# Patient Record
Sex: Male | Born: 1985 | Race: Black or African American | Hispanic: No | Marital: Married | Smoking: Never smoker
Health system: Southern US, Community
[De-identification: ages and names within clinical notes are randomized; demographics above are authoritative.]

## PROBLEM LIST (undated history)

## (undated) ENCOUNTER — Emergency Department (HOSPITAL_COMMUNITY): Disposition: A | Discharge status: Home / Self Care | Payer: BLUE CROSS/BLUE SHIELD

## (undated) DIAGNOSIS — W3400XA Accidental discharge from unspecified firearms or gun, initial encounter: Secondary | ICD-10-CM

## (undated) DIAGNOSIS — F329 Major depressive disorder, single episode, unspecified: Secondary | ICD-10-CM

---
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
## 2004-05-14 ENCOUNTER — Emergency Department (HOSPITAL_COMMUNITY): Admission: EM | Admit: 2004-05-14 | Discharge: 2004-05-14 | Payer: Self-pay

## 2007-10-13 ENCOUNTER — Emergency Department (HOSPITAL_COMMUNITY): Admission: EM | Admit: 2007-10-13 | Discharge: 2007-10-13 | Payer: Self-pay | Admitting: Family Medicine

## 2008-03-04 ENCOUNTER — Emergency Department (HOSPITAL_COMMUNITY): Admission: EM | Admit: 2008-03-04 | Discharge: 2008-03-04 | Payer: Self-pay | Admitting: Emergency Medicine

## 2008-08-09 ENCOUNTER — Emergency Department (HOSPITAL_COMMUNITY): Admission: EM | Admit: 2008-08-09 | Discharge: 2008-08-10 | Payer: Self-pay | Admitting: Emergency Medicine

## 2010-09-25 ENCOUNTER — Emergency Department (HOSPITAL_COMMUNITY)
Admission: EM | Admit: 2010-09-25 | Discharge: 2010-09-25 | Disposition: A | Discharge status: Home / Self Care | Payer: Self-pay | Attending: Emergency Medicine | Admitting: Emergency Medicine

## 2010-09-25 DIAGNOSIS — M542 Cervicalgia: Secondary | ICD-10-CM | POA: Insufficient documentation

## 2010-09-25 DIAGNOSIS — M545 Low back pain, unspecified: Secondary | ICD-10-CM | POA: Insufficient documentation

## 2010-09-25 DIAGNOSIS — F17200 Nicotine dependence, unspecified, uncomplicated: Secondary | ICD-10-CM | POA: Insufficient documentation

## 2010-09-25 DIAGNOSIS — N342 Other urethritis: Secondary | ICD-10-CM | POA: Insufficient documentation

## 2010-09-25 DIAGNOSIS — T148XXA Other injury of unspecified body region, initial encounter: Secondary | ICD-10-CM | POA: Insufficient documentation

## 2010-09-25 DIAGNOSIS — W010XXA Fall on same level from slipping, tripping and stumbling without subsequent striking against object, initial encounter: Secondary | ICD-10-CM | POA: Insufficient documentation

## 2010-09-25 LAB — URINALYSIS, ROUTINE W REFLEX MICROSCOPIC
Bilirubin Urine: NEGATIVE
Hgb urine dipstick: NEGATIVE
Ketones, ur: NEGATIVE mg/dL
Nitrite: NEGATIVE
Protein, ur: NEGATIVE mg/dL
Specific Gravity, Urine: 1.022 (ref 1.005–1.030)
Urine Glucose, Fasting: NEGATIVE mg/dL
Urobilinogen, UA: 1 mg/dL (ref 0.0–1.0)
pH: 7 (ref 5.0–8.0)

## 2010-09-25 LAB — URINE MICROSCOPIC-ADD ON

## 2010-09-26 LAB — GC/CHLAMYDIA PROBE AMP, GENITAL
Chlamydia, DNA Probe: POSITIVE — AB
GC Probe Amp, Genital: POSITIVE — AB

## 2011-04-20 ENCOUNTER — Inpatient Hospital Stay (HOSPITAL_COMMUNITY)
Admission: RE | Admit: 2011-04-20 | Disposition: A | Discharge status: Home / Self Care | Payer: Self-pay | Source: Ambulatory Visit | Attending: Family Medicine | Admitting: Family Medicine

## 2011-04-20 ENCOUNTER — Inpatient Hospital Stay (INDEPENDENT_AMBULATORY_CARE_PROVIDER_SITE_OTHER)
Admission: RE | Admit: 2011-04-20 | Discharge: 2011-04-20 | Disposition: A | Discharge status: Home / Self Care | Payer: Self-pay | Source: Ambulatory Visit | Attending: Emergency Medicine | Admitting: Emergency Medicine

## 2011-04-20 ENCOUNTER — Ambulatory Visit (INDEPENDENT_AMBULATORY_CARE_PROVIDER_SITE_OTHER): Payer: Self-pay

## 2011-04-20 DIAGNOSIS — J020 Streptococcal pharyngitis: Secondary | ICD-10-CM

## 2011-04-20 DIAGNOSIS — R0789 Other chest pain: Secondary | ICD-10-CM

## 2011-04-20 LAB — POCT RAPID STREP A: Streptococcus, Group A Screen (Direct): POSITIVE — AB

## 2012-05-10 ENCOUNTER — Emergency Department (HOSPITAL_COMMUNITY)
Admission: EM | Admit: 2012-05-10 | Discharge: 2012-05-10 | Disposition: A | Discharge status: Home / Self Care | Payer: Self-pay | Attending: Emergency Medicine | Admitting: Emergency Medicine

## 2012-05-10 ENCOUNTER — Encounter (HOSPITAL_COMMUNITY): Payer: Self-pay | Admitting: Emergency Medicine

## 2012-05-10 DIAGNOSIS — J111 Influenza due to unidentified influenza virus with other respiratory manifestations: Secondary | ICD-10-CM | POA: Insufficient documentation

## 2012-05-10 DIAGNOSIS — Z888 Allergy status to other drugs, medicaments and biological substances status: Secondary | ICD-10-CM | POA: Insufficient documentation

## 2012-05-10 DIAGNOSIS — F17200 Nicotine dependence, unspecified, uncomplicated: Secondary | ICD-10-CM | POA: Insufficient documentation

## 2012-05-10 MED ORDER — OXYCODONE-ACETAMINOPHEN 5-325 MG PO TABS: 1.0000 | ORAL_TABLET | ORAL | Status: DC | PRN

## 2012-05-10 MED ORDER — PROMETHAZINE HCL 12.5 MG PO TABS: 12.5000 mg | ORAL_TABLET | Freq: Four times a day (QID) | ORAL | Status: DC | PRN

## 2012-05-10 MED ORDER — OXYCODONE-ACETAMINOPHEN 5-325 MG PO TABS
1.0000 | ORAL_TABLET | Freq: Once | ORAL | Status: AC
  Administered 2012-05-10: 1 via ORAL
  Filled 2012-05-10: qty 1

## 2012-05-10 NOTE — ED Provider Notes (Signed)
History     CSN: [id number]  Arrival date & time 05/10/12  1430   First MD Initiated Contact with Patient 05/10/12 1509      No chief complaint on file.   (Consider location/radiation/quality/duration/timing/severity/associated sxs/prior treatment) HPI Pt has four days of malaise, body aches, headache, lack of appetite, and low grade fevers.  He has had a mild sore throat and has noticed some right sided neck lumps.  These are moderately tender.  Denies any nausea/vomiting but admits to some diarrhea.  He is feeling a bit better now than 4 days ago.  No rashes.  Denies any recent illicit substance use although he has used TCH several months ago.  History reviewed. No pertinent past medical history.  History reviewed. No pertinent past surgical history.  No family history on file.  History  Substance Use Topics  . Smoking status: Current Every Day Smoker  . Smokeless tobacco: Not on file  . Alcohol Use: Yes      Review of Systems  Constitutional: Positive for fever, activity change, appetite change and fatigue. Negative for diaphoresis.  HENT: Positive for ear pain, sore throat and neck pain. Negative for mouth sores, trouble swallowing, neck stiffness, voice change, postnasal drip and ear discharge.   Eyes: Negative for pain.  Respiratory: Negative for chest tightness and shortness of breath.   Cardiovascular: Negative for chest pain.  Gastrointestinal: Positive for diarrhea. Negative for nausea and vomiting.  Genitourinary: Negative for dysuria.  Musculoskeletal: Positive for myalgias.  Neurological: Positive for light-headedness. Negative for dizziness.    Allergies  Mucinex  Home Medications   Current Outpatient Rx  Name Route Sig Dispense Refill  . OXYCODONE-ACETAMINOPHEN 5-325 MG PO TABS Oral Take 1 tablet by mouth every 4 (four) hours as needed for pain. 15 tablet 0  . PROMETHAZINE HCL 12.5 MG PO TABS Oral Take 1 tablet (12.5 mg total) by mouth every 6 (six)  hours as needed for nausea. 30 tablet 0    BP 128/70  Pulse 74  Temp 98.4 F (36.9 C)  Resp 16  SpO2 99%  Physical Exam  Constitutional: He is oriented to person, place, and time. He appears well-developed.  HENT:  Head: Normocephalic and atraumatic.  Right Ear: External ear normal.  Left Ear: External ear normal.  Mouth/Throat: Oropharynx is clear and moist. No oropharyngeal exudate.       Slight clear effusion behind left TM  Eyes: Conjunctivae normal and EOM are normal. Pupils are equal, round, and reactive to light.  Neck: Normal range of motion. Neck supple.       Bilateral cervical adenopathy, R>L.  Right slightly tender, left non-tender  Cardiovascular: Normal rate, regular rhythm and normal heart sounds.   Pulmonary/Chest: Effort normal and breath sounds normal.  Abdominal: Soft. He exhibits no distension. There is no tenderness.  Lymphadenopathy:    He has cervical adenopathy.  Neurological: He is alert and oriented to person, place, and time. No cranial nerve deficit. Coordination normal.  Skin: Skin is warm and dry.    ED Course  Procedures (including critical care time)  Labs Reviewed - No data to display No results found.   1. Influenza-like illness       MDM  Signs and symptoms suggestive of influenza like illness.  Doubt serious bacterial infection.  Given mobility and lack of neck stiffness likelihood of meningitis is very low.  Will give symptomatic treatment with pain meds and meds for nausea if it develops.  Brent Bulla, MD 05/10/12 [phone]

## 2012-05-10 NOTE — ED Provider Notes (Signed)
I saw and evaluated the patient, reviewed the resident's note and I agree with the findings and plan. Patient with viral type symptoms. No apparent meningeal signs. Will discharge home with pain medicines. Does not appear to need strep test at this time.  Juliet Rude. Rubin Payor, MD 05/10/12 [phone]

## 2012-05-10 NOTE — ED Notes (Signed)
For f our days has had h/a and swelling on side of neck  Throat has been sore feeling bad

## 2013-09-07 ENCOUNTER — Emergency Department (HOSPITAL_COMMUNITY)
Admission: EM | Admit: 2013-09-07 | Discharge: 2013-09-07 | Disposition: A | Discharge status: Home / Self Care | Payer: Self-pay | Attending: Emergency Medicine | Admitting: Emergency Medicine

## 2013-09-07 ENCOUNTER — Encounter (HOSPITAL_COMMUNITY): Payer: Self-pay | Admitting: Emergency Medicine

## 2013-09-07 DIAGNOSIS — K044 Acute apical periodontitis of pulpal origin: Secondary | ICD-10-CM | POA: Insufficient documentation

## 2013-09-07 DIAGNOSIS — K047 Periapical abscess without sinus: Secondary | ICD-10-CM

## 2013-09-07 DIAGNOSIS — F17200 Nicotine dependence, unspecified, uncomplicated: Secondary | ICD-10-CM | POA: Insufficient documentation

## 2013-09-07 LAB — RAPID STREP SCREEN (MED CTR MEBANE ONLY): Streptococcus, Group A Screen (Direct): NEGATIVE

## 2013-09-07 MED ORDER — HYDROCODONE-ACETAMINOPHEN 5-325 MG PO TABS: 1.0000 | ORAL_TABLET | Freq: Four times a day (QID) | ORAL | Status: DC | PRN

## 2013-09-07 MED ORDER — HYDROCODONE-ACETAMINOPHEN 5-325 MG PO TABS
2.0000 | ORAL_TABLET | Freq: Once | ORAL | Status: AC
  Administered 2013-09-07: 2 via ORAL
  Filled 2013-09-07: qty 2

## 2013-09-07 MED ORDER — AMOXICILLIN 500 MG PO CAPS: 500.0000 mg | ORAL_CAPSULE | Freq: Three times a day (TID) | ORAL | Status: DC

## 2013-09-07 NOTE — ED Notes (Signed)
Pt comfortable with d/c and f/u instructions. Prscriptions x2.

## 2013-09-07 NOTE — ED Provider Notes (Signed)
CSN: [id number]     Arrival date & time 09/07/13  1814 History   This chart was scribed for non-physician practitioner Arthor CaptainAbigail Kynlie Jane, PA-C, working with Juliet RudeNathan R. Rubin PayorPickering, MD by Donne Anonayla Curran, ED Scribe. This patient was seen in room TR05C/TR05C and the patient's care was started at 1916.   First MD Initiated Contact with Patient 09/07/13 1916     Chief Complaint  Patient presents with  . Sore Throat    The history is provided by the patient. No language interpreter was used.   HPI Comments: Alex McburneyDerrick A Cain is a 28 y.o. male who presents to the Emergency Department complaining of a gradual onset, gradually worsening, constant, moderate lower right sided dental pain that began 2 days ago and a sore throat that began this morning. He reports associated subjective fever and pain with swallowing. He denies difficulty breathing or any other symptoms. He has not tried any OTC medication for his symptoms.   He is a current everyday smoker.  History reviewed. No pertinent past medical history. History reviewed. No pertinent past surgical history. History reviewed. No pertinent family history. History  Substance Use Topics  . Smoking status: Current Every Day Smoker  . Smokeless tobacco: Not on file  . Alcohol Use: Yes    Review of Systems  Constitutional: Positive for fever.  HENT: Positive for dental problem and sore throat.   Respiratory: Negative for shortness of breath.     Allergies  Mucinex  Home Medications   Current Outpatient Rx  Name  Route  Sig  Dispense  Refill  . oxyCODONE-acetaminophen (ROXICET) 5-325 MG per tablet   Oral   Take 1 tablet by mouth every 4 (four) hours as needed for pain.   15 tablet   0   . promethazine (PHENERGAN) 12.5 MG tablet   Oral   Take 1 tablet (12.5 mg total) by mouth every 6 (six) hours as needed for nausea.   30 tablet   0    BP 133/71  Pulse 96  Temp(Src) 98.5 F (36.9 C) (Oral)  Resp 18  Ht 5\' 8"  (1.727 m)  Wt 150 lb  (68.04 kg)  BMI 22.81 kg/m2  SpO2 99%  Physical Exam  Nursing note and vitals reviewed. Constitutional: He appears well-developed and well-nourished. No distress.  HENT:  Head: Normocephalic and atraumatic.  Mouth/Throat: Oropharynx is clear and moist. No oropharyngeal exudate, posterior oropharyngeal edema or posterior oropharyngeal erythema.  swelling of gum over medial side of the 2nd and 3rd molar and right tonsillar lymphadenopathy. No swelling or erythema in the pharynx.   Eyes: Conjunctivae are normal.  Neck: Neck supple. No tracheal deviation present.  Cardiovascular: Normal rate.   Pulmonary/Chest: Effort normal. No respiratory distress.  Musculoskeletal: Normal range of motion.  Lymphadenopathy:       Head (right side): Tonsillar adenopathy present.  Neurological: He is alert.  Skin: Skin is warm and dry.  Psychiatric: He has a normal mood and affect. His behavior is normal.    ED Course  Procedures (including critical care time) DIAGNOSTIC STUDIES: Oxygen Saturation is 99% on RA, normal by my interpretation.    COORDINATION OF CARE: 7:44 PM Discussed treatment plan which included Vicodin for pain with pt at bedside and pt agreed to plan. Advised pt to follow up with dentist, referral given. Return precautions advised. Will discharge home with an antibiotic.    Labs Review Labs Reviewed  RAPID STREP SCREEN  CULTURE, GROUP A STREP   Imaging Review No  results found.  EKG Interpretation   None       MDM   1. Dental infection    Patient with toothache.  No gross abscess.  Exam unconcerning for Ludwig's angina or spread of infection.  Will treat with penicillin and pain medicine.  Urged patient to follow-up with dentist.     I personally performed the services described in this documentation, which was scribed in my presence. The recorded information has been reviewed and is accurate.     [name], PA-C 09/09/13 1204

## 2013-09-07 NOTE — Discharge Instructions (Signed)
You have been diagnosed with Dental pain. Please call the follow up dentist first thing in the morning on Monday for a follow up appointment. Keep your discharge paperwork from today's visit to bring to the dentist office. You may also use the resource guide listed below to help you find a dentist if you do not already have one to followup with. It is very important that you get evaluated by a dentist as soon as possible.  Use your pain medication as prescribed and do not operate heavy machinery while on pain medication. Note that your pain medication contains acetaminophen (Tylenol) & its is not reccommended that you use additional acetaminophen (Tylenol) while taking this medication. Take your full course of antibiotics. Read the instructions below. ° °Eat a soft or liquid diet and rinse your mouth out after meals with warm water. You should see a dentist or return here at once if you have increased swelling, increased pain or uncontrolled bleeding from the site of your injury. ° ° °SEEK MEDICAL CARE IF:  °· You have increased pain not controlled with medicines.  °· You have swelling around your tooth, in your face or neck.  °· You have bleeding which starts, continues, or gets worse.  °· You have a fever >101 °· If you are unable to open your mouth °Soft Diet  °The soft diet may be recommended after you were put on a full liquid diet. A normal diet may follow. The soft diet can also be used after surgery if you are too ill to keep down a normal diet. The soft diet may also be needed if you have a hard time chewing foods.  °DESCRIPTION  °Tender foods are used. Foods do not need to be ground or pureed. Most raw fruits and vegetables and coarse breads and cereals should be avoided. Fried foods and highly seasoned foods may cause discomfort.  °NUTRITIONAL ADEQUACY  °A healthy diet is possible if foods from each of the basic food groups are eaten daily.  °SOFT DIET FOOD LISTS  °Milk/Dairy  °Allowed: Milk and milk  drinks, milk shakes, cream cheese, cottage cheese, mild cheeses.  °Avoid: Sharp or highly seasoned cheese. °Meat/Meat Substitutes  °Allowed: Broiled, roasted, baked, or stewed tender lean beef, mutton, lamb, veal, chicken, turkey, liver, ham, crisp bacon, white fish, tuna, salmon. Eggs, smooth peanut butter.  °Avoid: All fried meats, fish, or fowl. Rich gravies and sauces. Lunch meats, sausages, hot dogs. Meats with gristle, chunky peanut butter. °Breads/Grains  °Allowed: Rice, noodles, spaghetti, macaroni. Dry or cooked refined cereals, such as farina, cream of wheat, oatmeal, grits, whole-wheat cereals. Plain or toasted white or wheat blend or whole-grain breads, soda crackers or saltines, flour tortillas.  °Avoid: Wild rice, coarse cereals, such as bran. Seed in or on breads and crackers. Bread or bread products with nuts or seeds. °Fruits/Vegetables  °Allowed: Fruit and vegetable juices, well-cooked or canned fruits and vegetables, any dried fruit. One citrus fruit daily, 1 vitamin A source daily. Well-ripened, easy to chew fruits, sweet potatoes. Baked, boiled, mashed, creamed, scalloped, or au gratin potatoes. Broths or creamed soups made with allowed vegetables, strained tomatoes.  °Avoid: All gas-forming vegetables (corn, radishes, Brussels sprouts, onions, broccoli, cabbage, parsnips, turnips, chili peppers, pinto beans, split peas, dried beans). Fruits containing seeds and skin. Potato chips and corn chips. All others that are not made with allowed vegetables. Highly seasoned soups. °Desserts/Sweets  °Allowed: Simple desserts, such as custard, junkets, gelatin desserts, plain ice cream and sherbets, simple cakes   and cookies, allowed fruits, sugar, syrup, jelly, honey, plain hard candy, and molasses.  °Avoid: Rich pastries, any dessert containing dates, nuts, raisins, or coconut. Fried pastries, such as doughnuts. Chocolate. °Beverages  °Allowed: Fruit and vegetable juices. Caffeine-free carbonated drinks,  coffee, and tea.  °Avoid: Caffeinated beverages: coffee, tea, soda or pop. °Miscellaneous  °Allowed: Butter, cream, margarine, mayonnaise, oil. Cream sauces, salt, and mild spices.  °Avoid: Highly spiced salad dressings. Highly seasoned foods, hot sauce, mustard, horseradish, and pepper. °SAMPLE MENU  °Breakfast  °Orange juice.  °Oatmeal.  °Soft cooked egg.  °Toast and margarine.  °2% milk.  °Coffee. °Lunch  °Meatloaf.  °Mashed potato.  °Green beans.  °Lemon pudding.  °Bread and margarine.  °Coffee. °Dinner  °Consommé or apricot nectar.  °Chicken breast.  °Rice, peas, and carrots.  °Applesauce.  °Bread and margarine.  °2% milk. °To cut the amount of fat in your diet, omit margarine and use 1% or skim milk.  °NUTRIENT ANALYSIS  °Calories........................1953 Kcal.  °Protein.........................102 gm.  °Carbohydrate...............247 gm.  °Fat................................65 gm.  °Cholesterol...................449 mg.  °Dietary fiber.................19 gm.  °Vitamin A.....................2944 RE.  °Vitamin C.....................79 mg.  °Niacin..........................25 mg.  °Riboflavin....................2.0 mg.  °Thiamin.......................1.5 mg.  °Folate..........................249 mcg.  °Calcium.......................1030 mg.  °Phosphorus.................1782 mg.  °Zinc..............................12 mg.  °Iron..............................13 mg.  °Sodium.........................299 mg.  °Potassium....................3046 mg. °Document Released: 10/28/2007 Document Revised: 10/13/2011 Document Reviewed: 10/28/2007  °ExitCare® Patient Information ©2014 [employer].  ° °RESOURCE GUIDE ° ° °Dental Problems ° °Dr. Janna Civilis °$200 dollar visit °[address] °[place], [place] [address]  °[phone] °  ° °Patients with Medicaid: °[facility]                     [facility] °[address]                                           [address] °Phone:   [phone]                                                  Phone:  [phone] ° °If unable to pay or uninsured, contact:  [facility] or [facility] to become qualified for the adult dental clinic. ° °Chronic Pain Problems °Contact [facility]  [phone] °Patients need to be referred by their primary care doctor. ° °Insufficient Money for Medicine °Contact [employer]:  call "211" or [facility] 271-5999. ° °No Primary Care Doctor °Call [facility]  832-8000 °Other agencies that provide inexpensive medical care °   [facility]  [phone] °   [facility]  [phone] °   [facility]  [phone] °   [facility]  [phone] °   [facility]  [phone] °   [facility]  [phone] ° °Psychological Services °[facility]  [phone] °[facility]  [phone] °[facility]   [phone] (emergency services [phone]) ° °Substance Abuse Resources °[facility]  336-882-2125 °[facility] 336-784-9470 °[facility] 336-285-9073 °[facility] 336-845-3988 °Residential & Outpatient Substance Abuse Program  800-659-3381 ° °Abuse/Neglect °[place] [facility] Hotline (336) 641-3795 °[place] [facility] Abuse Hotline 800-378-5315 (After Hours) ° °Emergency Shelter °[place]   [facility] (336) 271-5985 ° °Maternity Homes °[facility] (336) 275-9566 °[facility] (704) 372-4663 ° °MRSA Hotline #:   832-7006 ° ° ° °[place] [facility] ° °[facility]     [employer]                          [facility] °[address] [place]                       [address]      [address]  °[place]                                                [place]                            [place] °Phone:  349-3220                                   Phone:  [phone]                 Phone:   [phone] ° °[facility] °Phone:  342-8316 ° °[place] Child Abuse Hotline °(336) 342-1394 °(336) 342-3537 (After Hours) ° ° ° ° ° ° °

## 2013-09-07 NOTE — ED Notes (Signed)
Pt in c/o sore throat since yesterday, denies fever at home but feels like his throat is swollen, no medication PTA, no distress noted

## 2013-09-09 LAB — CULTURE, GROUP A STREP

## 2013-09-12 NOTE — ED Provider Notes (Signed)
Medical screening examination/treatment/procedure(s) were performed by non-physician practitioner and as supervising physician I was immediately available for consultation/collaboration.  EKG Interpretation   None        Bearl Talarico R. Shanicka Oldenkamp, MD 09/12/13 2044 

## 2014-03-14 ENCOUNTER — Emergency Department (HOSPITAL_COMMUNITY): Payer: Self-pay

## 2014-03-14 ENCOUNTER — Encounter (HOSPITAL_COMMUNITY): Payer: Self-pay | Admitting: Emergency Medicine

## 2014-03-14 ENCOUNTER — Emergency Department (HOSPITAL_COMMUNITY)
Admission: EM | Admit: 2014-03-14 | Discharge: 2014-03-14 | Disposition: A | Discharge status: Home / Self Care | Payer: Self-pay | Attending: Emergency Medicine | Admitting: Emergency Medicine

## 2014-03-14 DIAGNOSIS — F17200 Nicotine dependence, unspecified, uncomplicated: Secondary | ICD-10-CM | POA: Insufficient documentation

## 2014-03-14 DIAGNOSIS — Z792 Long term (current) use of antibiotics: Secondary | ICD-10-CM | POA: Insufficient documentation

## 2014-03-14 DIAGNOSIS — M5412 Radiculopathy, cervical region: Secondary | ICD-10-CM | POA: Insufficient documentation

## 2014-03-14 DIAGNOSIS — M542 Cervicalgia: Secondary | ICD-10-CM | POA: Insufficient documentation

## 2014-03-14 DIAGNOSIS — R209 Unspecified disturbances of skin sensation: Secondary | ICD-10-CM | POA: Insufficient documentation

## 2014-03-14 MED ORDER — KETOROLAC TROMETHAMINE 60 MG/2ML IM SOLN
60.0000 mg | Freq: Once | INTRAMUSCULAR | Status: AC
  Administered 2014-03-14: 60 mg via INTRAMUSCULAR
  Filled 2014-03-14: qty 2

## 2014-03-14 MED ORDER — PREDNISONE 20 MG PO TABS: 40.0000 mg | ORAL_TABLET | Freq: Every day | ORAL | Status: DC

## 2014-03-14 MED ORDER — NAPROXEN 500 MG PO TABS: 500.0000 mg | ORAL_TABLET | Freq: Two times a day (BID) | ORAL | Status: DC

## 2014-03-14 MED ORDER — CYCLOBENZAPRINE HCL 10 MG PO TABS: 10.0000 mg | ORAL_TABLET | Freq: Two times a day (BID) | ORAL | Status: DC | PRN

## 2014-03-14 NOTE — ED Provider Notes (Signed)
CSN: [id number]     Arrival date & time 03/14/14  1100 History  This chart was scribed for non-physician practitioner, Jaynie Crumble, PA-C working with Samuel Jester, DO by Greggory Stallion, ED scribe. This patient was seen in room TR05C/TR05C and the patient's care was started at 11:14 AM.    Chief Complaint  Patient presents with  . Neck Pain  . Back Pain   The history is provided by the patient. No language interpreter was used.   HPI Comments: Alex Cain is a 28 y.o. male who presents to the Emergency Department complaining of gradual onset sharp right sided neck pain that radiates into his upper back and right shoulder that started 2 weeks ago. States pain sometimes goes into his right axilla. Denies known injury. Pain is worse in the mornings. Reports intermittent numbness in his right thumb. Movement worsens the pain. Pt has taken BCs and goody powders with no relief. He has also used a heating pad with no relief. Denies past injury to neck.   History reviewed. No pertinent past medical history. History reviewed. No pertinent past surgical history. History reviewed. No pertinent family history. History  Substance Use Topics  . Smoking status: Current Every Day Smoker  . Smokeless tobacco: Not on file  . Alcohol Use: Yes    Review of Systems  Musculoskeletal: Positive for arthralgias, back pain and neck pain.  Neurological: Positive for numbness.  All other systems reviewed and are negative.  Allergies  Mucinex  Home Medications   Prior to Admission medications   Medication Sig Start Date End Date Taking? Authorizing Provider  amoxicillin (AMOXIL) 500 MG capsule Take 1 capsule (500 mg total) by mouth 3 (three) times daily. 09/07/13   [name], PA-C  HYDROcodone-acetaminophen (NORCO) 5-325 MG per tablet Take 1-2 tablets by mouth every 6 (six) hours as needed for moderate pain. 09/07/13   Abigail Harris, PA-C   BP 117/78  Pulse 72  Temp(Src) 98.2 F  (36.8 C) (Oral)  Resp 18  SpO2 100%  Physical Exam  Nursing note and vitals reviewed. Constitutional: He is oriented to person, place, and time. He appears well-developed and well-nourished. No distress.  HENT:  Head: Normocephalic and atraumatic.  Eyes: Conjunctivae and EOM are normal.  Neck: Neck supple. No tracheal deviation present.  No midline cervical spine tenderness. Right pericervical tenderness and right trapezius tenderness  Cardiovascular: Normal rate.   Pulmonary/Chest: Effort normal. No respiratory distress.  Musculoskeletal: Normal range of motion.  Normal appearing right shoulder. Tender over pectoralis muscle and right periscapular musculature. Full rom of the shoulder joint actively and passively. Distal radial pulses intact. Pt is able to make thumbs up, cross 2nd and 3rd digit, able to oppose thumb. Bicep, tricep deltoid strength intact.   Neurological: He is alert and oriented to person, place, and time.  Sensation intact in all dermatomes of the right hand to soft and sharp touch  Skin: Skin is warm and dry.  Psychiatric: He has a normal mood and affect. His behavior is normal.    ED Course  Procedures (including critical care time)  DIAGNOSTIC STUDIES: Oxygen Saturation is 100% on RA, normal by my interpretation.    COORDINATION OF CARE: 11:19 AM-Discussed treatment plan which includes neck xray with pt at bedside and pt agreed to plan.   Labs Review Labs Reviewed - No data to display  Imaging Review Dg Cervical Spine Complete  03/14/2014   CLINICAL DATA:  Right side neck pain.  EXAM: CERVICAL  SPINE  4+ VIEWS  COMPARISON:  None.  FINDINGS: Vertebral body height and alignment are normal. Intervertebral disc space height is maintained. Prevertebral soft tissues appear normal. Lung apices are clear.  IMPRESSION: Negative exam.   Electronically Signed   By: Drusilla Kannerhomas  Dalessio M.D.   On: 03/14/2014 11:57     EKG Interpretation None      MDM   Final  diagnoses:  Cervical radiculitis    Pt with right shoulder and right paracervical pain. Reports numbness sensation in right thumb, however on exam, neurovascularly intact. Strength intact. Will treat with prednisone, flexeril, follow up with PCP. Most likely muscle spasms vs radicular pain.   Filed Vitals:   03/14/14 1104 03/14/14 1233  BP: 117/78 117/60  Pulse: 72 61  Temp: 98.2 F (36.8 C) 98.3 F (36.8 C)  TempSrc: Oral Oral  Resp: 18 18  SpO2: 100% 100%     I personally performed the services described in this documentation, which was scribed in my presence. The recorded information has been reviewed and is accurate.  Lottie Musselatyana A Chelli Yerkes, PA-C 03/14/14 1253

## 2014-03-14 NOTE — ED Notes (Signed)
Pt reports pain to neck and upper back x 2 weeks, increases with movement. No acute distress noted.

## 2014-03-14 NOTE — Discharge Instructions (Signed)
Naprosyn for pain. Flexeril for spasms.  Prednisone for inflammation. No lifting heavier than 5 lb for 1 week. Follow up with primary care doctor or an urgent care.    Cervical Radiculopathy Cervical radiculopathy happens when a nerve in the neck is pinched or bruised by a slipped (herniated) disk or by arthritic changes in the bones of the cervical spine. This can occur due to an injury or as part of the normal aging process. Pressure on the cervical nerves can cause pain or numbness that runs from your neck all the way down into your arm and fingers. CAUSES  There are many possible causes, including:  Injury.  Muscle tightness in the neck from overuse.  Swollen, painful joints (arthritis).  Breakdown or degeneration in the bones and joints of the spine (spondylosis) due to aging.  Bone spurs that may develop near the cervical nerves. SYMPTOMS  Symptoms include pain, weakness, or numbness in the affected arm and hand. Pain can be severe or irritating. Symptoms may be worse when extending or turning the neck. DIAGNOSIS  Your caregiver will ask about your symptoms and do a physical exam. He or she may test your strength and reflexes. X-rays, CT scans, and MRI scans may be needed in cases of injury or if the symptoms do not go away after a period of time. Electromyography (EMG) or nerve conduction testing may be done to study how your nerves and muscles are working. TREATMENT  Your caregiver may recommend certain exercises to help relieve your symptoms. Cervical radiculopathy can, and often does, get better with time and treatment. If your problems continue, treatment options may include:  Wearing a soft collar for short periods of time.  Physical therapy to strengthen the neck muscles.  Medicines, such as nonsteroidal anti-inflammatory drugs (NSAIDs), oral corticosteroids, or spinal injections.  Surgery. Different types of surgery may be done depending on the cause of your  problems. HOME CARE INSTRUCTIONS   Put ice on the affected area.  Put ice in a plastic bag.  Place a towel between your skin and the bag.  Leave the ice on for 15-20 minutes, 03-04 times a day or as directed by your caregiver.  If ice does not help, you can try using heat. Take a warm shower or bath, or use a hot water bottle as directed by your caregiver.  You may try a gentle neck and shoulder massage.  Use a flat pillow when you sleep.  Only take over-the-counter or prescription medicines for pain, discomfort, or fever as directed by your caregiver.  If physical therapy was prescribed, follow your caregiver's directions.  If a soft collar was prescribed, use it as directed. SEEK IMMEDIATE MEDICAL CARE IF:   Your pain gets much worse and cannot be controlled with medicines.  You have weakness or numbness in your hand, arm, face, or leg.  You have a high fever or a stiff, rigid neck.  You lose bowel or bladder control (incontinence).  You have trouble with walking, balance, or speaking. MAKE SURE YOU:   Understand these instructions.  Will watch your condition.  Will get help right away if you are not doing well or get worse. Document Released: 04/15/2001 Document Revised: 10/13/2011 Document Reviewed: 03/04/2011 [facility] Patient Information 2015 [place] [place]. This information is not intended to replace advice given to you by your health care provider. Make sure you discuss any questions you have with your health care provider.

## 2014-03-16 NOTE — ED Provider Notes (Signed)
Medical screening examination/treatment/procedure(s) were performed by non-physician practitioner and as supervising physician I was immediately available for consultation/collaboration.   EKG Interpretation None        Basel Defalco, DO 03/16/14 1614 

## 2014-12-08 ENCOUNTER — Emergency Department (HOSPITAL_COMMUNITY)
Admission: EM | Admit: 2014-12-08 | Discharge: 2014-12-08 | Disposition: A | Discharge status: Home / Self Care | Payer: Self-pay | Attending: Emergency Medicine | Admitting: Emergency Medicine

## 2014-12-08 ENCOUNTER — Encounter (HOSPITAL_COMMUNITY): Payer: Self-pay | Admitting: Emergency Medicine

## 2014-12-08 DIAGNOSIS — R599 Enlarged lymph nodes, unspecified: Secondary | ICD-10-CM

## 2014-12-08 DIAGNOSIS — Z720 Tobacco use: Secondary | ICD-10-CM | POA: Insufficient documentation

## 2014-12-08 DIAGNOSIS — Z8659 Personal history of other mental and behavioral disorders: Secondary | ICD-10-CM | POA: Insufficient documentation

## 2014-12-08 DIAGNOSIS — Z7952 Long term (current) use of systemic steroids: Secondary | ICD-10-CM | POA: Insufficient documentation

## 2014-12-08 DIAGNOSIS — R590 Localized enlarged lymph nodes: Secondary | ICD-10-CM | POA: Insufficient documentation

## 2014-12-08 DIAGNOSIS — Z791 Long term (current) use of non-steroidal anti-inflammatories (NSAID): Secondary | ICD-10-CM | POA: Insufficient documentation

## 2014-12-08 DIAGNOSIS — K088 Other specified disorders of teeth and supporting structures: Secondary | ICD-10-CM | POA: Insufficient documentation

## 2014-12-08 HISTORY — DX: Major depressive disorder, single episode, unspecified: F32.9

## 2014-12-08 MED ORDER — PENICILLIN V POTASSIUM 500 MG PO TABS: 500.0000 mg | ORAL_TABLET | Freq: Three times a day (TID) | ORAL | Status: DC

## 2014-12-08 NOTE — Discharge Instructions (Signed)
Please read and follow all provided instructions.  Your diagnoses today include:  1. Enlarged lymph node     Tests performed today include:  Vital signs. See below for your results today.   Medications prescribed:   Penicillin - antibiotic  You have been prescribed an antibiotic medicine: take the entire course of medicine even if you are feeling better. Stopping early can cause the antibiotic not to work.  Home care instructions:  Follow any educational materials contained in this packet.  Follow-up instructions: Please follow-up with your primary care provider as needed for further evaluation of your symptoms.  Return instructions:   Please return to the Emergency Department if you experience worsening symptoms.   Please return if you have any other emergent concerns.  Additional Information:  Your vital signs today were: BP 117/71 mmHg   Pulse 68   Temp(Src) 97.7 F (36.5 C) (Oral)   Resp 19   Ht 5\' 8"  (1.727 m)   Wt 140 lb (63.504 kg)   BMI 21.29 kg/m2   SpO2 100% If your blood pressure (BP) was elevated above 135/85 this visit, please have this repeated by your doctor within one month. ---------------

## 2014-12-08 NOTE — ED Provider Notes (Signed)
CSN: [id number]     Arrival date & time 12/08/14  [id number] History  This chart was scribed for Rhea BleacherJosh Frederich Montilla, PA-C working with Benjiman CoreNathan Pickering, MD by Elveria Risingimelie Horne, ED Scribe. This patient was seen in room TR10C/TR10C and the patient's care was started at 10:53 AM.   Chief Complaint  Patient presents with  . knot on neck    The history is provided by the patient. No language interpreter was used.   HPI Comments: Alex Cain is a 29 y.o. male who presents to the Emergency Department complaining of mass to left lateral neck which he first noticed last night. Patient reports aching gums for two days now, but states that he has otherwise been healthy. Patient denies ear pain, nasal congestion, shortness of breath or neck pain. Patient denies known cuts, abrasions, or other skin irritations at the site.   Patient is an admitted smoker. Patient states that he attempted to visit his PCP today for evaluation, but his doctor was out of the office, hence his coming to ED.   Past Medical History  Diagnosis Date  . Major depressive disorder    History reviewed. No pertinent past surgical history. No family history on file. History  Substance Use Topics  . Smoking status: Current Every Day Smoker -- 0.50 packs/day    Types: Cigarettes  . Smokeless tobacco: Not on file  . Alcohol Use: Yes     Comment: socially    Review of Systems  Constitutional: Negative for fever.  HENT: Positive for dental problem. Negative for ear pain, facial swelling, mouth sores, sore throat and trouble swallowing.   Respiratory: Negative for shortness of breath and stridor.   Musculoskeletal: Negative for neck pain.  Skin: Negative for color change.  Neurological: Negative for light-headedness and headaches.  Hematological: Positive for adenopathy.    Allergies  Mucinex  Home Medications   Prior to Admission medications   Medication Sig Start Date End Date Taking? Authorizing Provider  cyclobenzaprine  (FLEXERIL) 10 MG tablet Take 1 tablet (10 mg total) by mouth 2 (two) times daily as needed for muscle spasms. 03/14/14   Tatyana Kirichenko, PA-C  naproxen (NAPROSYN) 500 MG tablet Take 1 tablet (500 mg total) by mouth 2 (two) times daily. 03/14/14   Tatyana Kirichenko, PA-C  predniSONE (DELTASONE) 20 MG tablet Take 2 tablets (40 mg total) by mouth daily. 03/14/14   Jaynie Crumbleatyana Kirichenko, PA-C   Triage Vitals: BP 117/71 mmHg  Pulse 68  Temp(Src) 97.7 F (36.5 C) (Oral)  Resp 19  Ht 5\' 8"  (1.727 m)  Wt 140 lb (63.504 kg)  BMI 21.29 kg/m2  SpO2 100%  Physical Exam  Constitutional: He is oriented to person, place, and time. He appears well-developed and well-nourished. No distress.  HENT:  Head: Normocephalic and atraumatic.  Right Ear: Tympanic membrane, external ear and ear canal normal.  Left Ear: Tympanic membrane, external ear and ear canal normal.  Nose: Nose normal.  Mouth/Throat: Uvula is midline, oropharynx is clear and moist and mucous membranes are normal.  No obvious dental infection. Patient notes irritation at the gumline along teeth 21-23.  Eyes: EOM are normal.  Neck: Neck supple. No tracheal deviation present.  Cardiovascular: Normal rate.   Pulmonary/Chest: Effort normal. No respiratory distress.  Musculoskeletal: Normal range of motion.  Lymphadenopathy:       Head (right side): No submental, no submandibular, no tonsillar, no preauricular, no posterior auricular and no occipital adenopathy present.       Head (left  side): No submental, no submandibular, no tonsillar, no preauricular, no posterior auricular and no occipital adenopathy present.    He has cervical adenopathy.       Right cervical: No superficial cervical, no deep cervical and no posterior cervical adenopathy present.      Left cervical: Posterior cervical adenopathy present. No superficial cervical and no deep cervical adenopathy present.    He has no axillary adenopathy.       Right: No supraclavicular and  no epitrochlear adenopathy present.       Left: No supraclavicular and no epitrochlear adenopathy present.  Neurological: He is alert and oriented to person, place, and time.  Skin: Skin is warm and dry.  Psychiatric: He has a normal mood and affect. His behavior is normal.  Nursing note and vitals reviewed.   ED Course  Procedures (including critical care time)  COORDINATION OF CARE: 11:00 AM- Patient advised to monitor lymph node for progressive swelling and erythema. Patient given return precautions. Discussed treatment plan with patient at bedside and patient agreed to plan.   Labs Review Labs Reviewed - No data to display  Imaging Review No results found.   EKG Interpretation None       Vital signs reviewed and are as follows: Filed Vitals:   12/08/14 1119  BP: 121/78  Pulse: 66  Temp:   Resp: 20   Encouraged close monitoring of the area and PCP follow-up in 2-3 weeks if not resolved.  MDM   Final diagnoses:  Enlarged lymph node   Patient with isolated left posterior chain cervical lymph node, nontender, mobile. It is been there for 1 day. Possible dental infection but exam is not impressive. No other local infections noted which may cause lymph node to be reactive. Treatment as above. Given duration of symptoms, do not feel that further evaluation with CBC, chest x-ray indicated currently.  I personally performed the services described in this documentation, which was scribed in my presence. The recorded information has been reviewed and is accurate.    Renne CriglerJoshua Tayvin Preslar, PA-C 12/08/14 1402  Benjiman CoreNathan Pickering, MD 12/08/14 [phone]

## 2014-12-08 NOTE — ED Notes (Signed)
Patient states knot came up on neck night before last.   Patient denies any other symptoms.

## 2016-04-27 ENCOUNTER — Encounter (HOSPITAL_COMMUNITY): Payer: Self-pay

## 2016-04-27 ENCOUNTER — Emergency Department (HOSPITAL_COMMUNITY): Payer: Self-pay

## 2016-04-27 ENCOUNTER — Emergency Department (HOSPITAL_COMMUNITY)
Admission: EM | Admit: 2016-04-27 | Discharge: 2016-04-27 | Disposition: A | Discharge status: Home / Self Care | Payer: Self-pay | Attending: Emergency Medicine | Admitting: Emergency Medicine

## 2016-04-27 DIAGNOSIS — F17210 Nicotine dependence, cigarettes, uncomplicated: Secondary | ICD-10-CM | POA: Insufficient documentation

## 2016-04-27 DIAGNOSIS — R0789 Other chest pain: Secondary | ICD-10-CM

## 2016-04-27 DIAGNOSIS — M940 Chondrocostal junction syndrome [Tietze]: Secondary | ICD-10-CM | POA: Insufficient documentation

## 2016-04-27 DIAGNOSIS — Z7982 Long term (current) use of aspirin: Secondary | ICD-10-CM | POA: Insufficient documentation

## 2016-04-27 LAB — BASIC METABOLIC PANEL
Anion gap: 5 (ref 5–15)
BUN: 9 mg/dL (ref 6–20)
CO2: 27 mmol/L (ref 22–32)
Calcium: 9 mg/dL (ref 8.9–10.3)
Chloride: 106 mmol/L (ref 101–111)
Creatinine, Ser: 1.05 mg/dL (ref 0.61–1.24)
GFR calc Af Amer: >60 mL/min (ref >60)
GFR calc non Af Amer: >60 mL/min (ref >60)
Glucose, Bld: 98 mg/dL (ref 65–99)
Potassium: 3.5 mmol/L (ref 3.5–5.1)
Sodium: 138 mmol/L (ref 135–145)

## 2016-04-27 LAB — CBC
HCT: 43.6 % (ref 39.0–52.0)
Hemoglobin: 14.6 g/dL (ref 13.0–17.0)
MCH: 31.3 pg (ref 26.0–34.0)
MCHC: 33.5 g/dL (ref 30.0–36.0)
MCV: 93.6 fL (ref 78.0–100.0)
PLATELETS: 223 10*3/uL (ref 150–400)
RBC: 4.66 MIL/uL (ref 4.22–5.81)
RDW: 13.6 % (ref 11.5–15.5)
WBC: 9.1 10*3/uL (ref 4.0–10.5)

## 2016-04-27 LAB — I-STAT TROPONIN, ED: Troponin i, poc: 0 ng/mL (ref 0.00–0.08)

## 2016-04-27 MED ORDER — BENZONATATE 100 MG PO CAPS: 100.0000 mg | ORAL_CAPSULE | Freq: Three times a day (TID) | ORAL | 0 refills | Status: AC | PRN

## 2016-04-27 MED ORDER — NAPROXEN 500 MG PO TABS: 500.0000 mg | ORAL_TABLET | Freq: Two times a day (BID) | ORAL | 0 refills | Status: AC

## 2016-04-27 MED ORDER — ACETAMINOPHEN 500 MG PO TABS
1000.0000 mg | ORAL_TABLET | Freq: Once | ORAL | Status: AC
  Administered 2016-04-27: 1000 mg via ORAL
  Filled 2016-04-27: qty 2

## 2016-04-27 MED ORDER — KETOROLAC TROMETHAMINE 60 MG/2ML IM SOLN
60.0000 mg | Freq: Once | INTRAMUSCULAR | Status: AC
  Administered 2016-04-27: 60 mg via INTRAMUSCULAR
  Filled 2016-04-27: qty 2

## 2016-04-27 MED ORDER — BENZONATATE 100 MG PO CAPS
100.0000 mg | ORAL_CAPSULE | Freq: Once | ORAL | Status: AC
  Administered 2016-04-27: 100 mg via ORAL
  Filled 2016-04-27: qty 1

## 2016-04-27 NOTE — ED Notes (Signed)
Declined W/C at D/C and was escorted to lobby by RN. 

## 2016-04-27 NOTE — ED Triage Notes (Signed)
Pt here with c/o left sided sharp CP and SOB that started two hours PTA and pain radiates down his left arm. RR unlabored.

## 2016-04-27 NOTE — ED Provider Notes (Signed)
[facility]-EMERGENCY DEPT Provider Note   CSN: [id number] Arrival date & time: 04/27/16  1358     History   Chief Complaint Chief Complaint  Patient presents with  . Chest Pain    HPI Alex Cain is a 30 y.o. male.  The history is provided by the patient (joined in ED by male companion).  Chest Pain   This is a new problem. The current episode started 3 to 5 hours ago. The problem occurs constantly. The problem has not changed since onset.The pain is present in the lateral region (left-sided). The pain is moderate. The quality of the pain is described as sharp. The pain does not radiate. Duration of episode(s) is 4 hours. Associated symptoms include headaches and shortness of breath. Pertinent negatives include no abdominal pain, no back pain, no claudication, no cough, no diaphoresis, no dizziness, no fever, no leg pain, no lower extremity edema, no malaise/fatigue, no nausea, no near-syncope, no vomiting and no weakness. He has tried nothing for the symptoms. There are no known risk factors.    Past Medical History:  Diagnosis Date  . Major depressive disorder (HCC)     There are no active problems to display for this patient.   History reviewed. No pertinent surgical history.     Home Medications    Prior to Admission medications   Medication Sig Start Date End Date Taking? Authorizing Provider  Aspirin-Salicylamide-Caffeine (BC HEADACHE POWDER PO) Take 1 packet by mouth 2 (two) times daily as needed (headache).   Yes Historical Provider, MD  benzonatate (TESSALON) 100 MG capsule Take 1 capsule (100 mg total) by mouth 3 (three) times daily as needed for cough. 04/27/16 05/02/16  Horald Pollen, MD  naproxen (NAPROSYN) 500 MG tablet Take 1 tablet (500 mg total) by mouth 2 (two) times daily. 04/27/16 05/02/16  Horald Pollen, MD    Family History No family history on file.  Social History Social History  Substance Use Topics  . Smoking status: Current Every  Day Smoker    Packs/day: 0.50    Types: Cigarettes  . Smokeless tobacco: Never Used  . Alcohol use Yes     Comment: socially     Allergies   Mucinex [guaifenesin er]   Review of Systems Review of Systems  Constitutional: Negative for diaphoresis, fever and malaise/fatigue.  HENT: Positive for congestion, postnasal drip and sinus pressure. Negative for trouble swallowing and voice change.   Eyes: Negative for photophobia and visual disturbance.  Respiratory: Positive for shortness of breath. Negative for cough.   Cardiovascular: Positive for chest pain. Negative for claudication and near-syncope.  Gastrointestinal: Negative for abdominal pain, nausea and vomiting.  Genitourinary: Negative for flank pain.  Musculoskeletal: Negative for back pain.  Skin: Negative for rash.  Neurological: Positive for headaches. Negative for dizziness and weakness.       Mild frontal headache 2/2 sinus pressure     Physical Exam Updated Vital Signs BP 108/72   Pulse 62   Temp 98.1 F (36.7 C) (Oral)   Resp 22   Ht 5\' 9"  (1.753 m)   Wt 61.2 kg   SpO2 97%   BMI 19.94 kg/m   Physical Exam  Constitutional: He is oriented to person, place, and time. He appears well-developed and well-nourished. No distress.  Pleasant, cooperative, thin, well-appearing  HENT:  Head: Normocephalic and atraumatic.  Eyes: Conjunctivae are normal.  Muddy sclera  Neck: Normal range of motion. Neck supple. No JVD present. No tracheal deviation present.  No  C-spine ttp  Cardiovascular: Normal rate and regular rhythm.  Exam reveals no gallop and no friction rub.   No murmur heard. Pulmonary/Chest: Effort normal and breath sounds normal. No respiratory distress. He exhibits tenderness.  Left parasternal chest wall ttp  Abdominal: Soft. He exhibits no distension. There is no tenderness.  Musculoskeletal: Normal range of motion. He exhibits no edema.  Symmetric size and appearance of b/l Le's, no calf swelling or  tenderness  Neurological: He is alert and oriented to person, place, and time. No cranial nerve deficit. He exhibits normal muscle tone. Coordination normal.  Ambulates with steady stable gait  Skin: Skin is warm and dry. Capillary refill takes less than 2 seconds. No rash noted. He is not diaphoretic.  Psychiatric: He has a normal mood and affect.  Nursing note and vitals reviewed.    ED Treatments / Results  Labs (all labs ordered are listed, but only abnormal results are displayed) Labs Reviewed  BASIC METABOLIC PANEL  CBC  I-STAT TROPOININ, ED    EKG  EKG Interpretation  Date/Time:  Sunday April 27 2016 14:04:09 EDT Ventricular Rate:  82 PR Interval:  124 QRS Duration: 88 QT Interval:  358 QTC Calculation: 418 R Axis:   84 Text Interpretation:  Normal sinus rhythm with sinus arrhythmia Normal ECG No previous ECGs available Confirmed by Manus Gunning  MD, Jeannett Senior [phone]) on 04/27/2016 2:10:43 PM Also confirmed by Manus Gunning  MD, STEPHEN [phone]), editor Valentina Lucks CT, Jola Babinski [phone])  on 04/27/2016 2:31:57 PM       Radiology Dg Chest 2 View  Result Date: 04/27/2016 CLINICAL DATA:  Left chest pain. Smoker. Some shortness of breath with the pain. EXAM: CHEST  2 VIEW COMPARISON:  04/20/2011. FINDINGS: Normal sized heart. Bilateral hair braid artifacts and probable hair band. Otherwise, clear lungs. No pneumothorax seen. Normal appearing bones. IMPRESSION: Normal examination with the exception of hair braid artifacts and probable hair band. Electronically Signed   By: Beckie Salts M.D.   On: 04/27/2016 14:50    Procedures Procedures (including critical care time)  Medications Ordered in ED Medications  ketorolac (TORADOL) injection 60 mg (60 mg Intramuscular Given 04/27/16 1717)  acetaminophen (TYLENOL) tablet 1,000 mg (1,000 mg Oral Given 04/27/16 1717)  benzonatate (TESSALON) capsule 100 mg (100 mg Oral Given 04/27/16 1717)     Initial Impression / Assessment and Plan / ED Course    I have reviewed the triage vital signs and the nursing notes.  Pertinent labs & imaging results that were available during my care of the patient were reviewed by me and considered in my medical decision making (see chart for details).  Clinical Course   Treyveon Mochizuki is a 30 y.o. male without significant PMHx who presents to ED for rapid-onset of sharp left-sided CP and some dyspnea started a couple hours PTA. States it is worse with deep breaths and coughing. Reports about 2 weeks of URI sx as well. No trauma. No ptx on cxr. Doubt ACS, no risk factors for Pe. Concern for costochondritis. Sx of rib pain, headache, and sinus congestion improve significantly during ED visit with tx. Rx tessalon and naproxen. Advised to f/u with PCP this week or next, as pt may need outpatient evaluation for asthma, lungs appear hyperinflated on CXR. Moving good air on exam, doubt asthma exacerbation, no dyspnea while ambulating in Ed. Advised pt to stop or cut down on smoking, to return to ER for any new, worse, or concerning symptoms. He demonstrates understanding of this and  comfort with d/c home.  Pt condition, course, and discharge were discussed with attending physician Dr. Blane OharaJoshua Zavitz.  Final Clinical Impressions(s) / ED Diagnoses   Final diagnoses:  Atypical chest pain  Chest wall pain  Costochondritis    New Prescriptions Discharge Medication List as of 04/27/2016  6:18 PM    START taking these medications   Details  benzonatate (TESSALON) 100 MG capsule Take 1 capsule (100 mg total) by mouth 3 (three) times daily as needed for cough., Starting Sun 04/27/2016, Until Fri 05/02/2016, Print         Horald PollenAudrey Jacory Kamel, MD 04/28/16 [id number]    Blane OharaJoshua Zavitz, MD 04/29/16 1544

## 2016-06-17 ENCOUNTER — Ambulatory Visit (HOSPITAL_COMMUNITY)
Admission: EM | Admit: 2016-06-17 | Discharge: 2016-06-17 | Disposition: A | Discharge status: Home / Self Care | Payer: Self-pay | Attending: Family Medicine | Admitting: Family Medicine

## 2016-06-17 ENCOUNTER — Encounter (HOSPITAL_COMMUNITY): Payer: Self-pay | Admitting: Emergency Medicine

## 2016-06-17 DIAGNOSIS — S39011A Strain of muscle, fascia and tendon of abdomen, initial encounter: Secondary | ICD-10-CM

## 2016-06-17 DIAGNOSIS — M542 Cervicalgia: Secondary | ICD-10-CM

## 2016-06-17 MED ORDER — NAPROXEN 500 MG PO TABS: 500.0000 mg | ORAL_TABLET | Freq: Two times a day (BID) | ORAL | 0 refills | Status: DC

## 2016-06-17 MED ORDER — CYCLOBENZAPRINE HCL 10 MG PO TABS: 10.0000 mg | ORAL_TABLET | Freq: Two times a day (BID) | ORAL | 0 refills | Status: DC | PRN
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
## 2019-12-31 ENCOUNTER — Other Ambulatory Visit: Payer: Self-pay

## 2019-12-31 ENCOUNTER — Encounter (HOSPITAL_COMMUNITY): Payer: Self-pay

## 2019-12-31 ENCOUNTER — Emergency Department (HOSPITAL_COMMUNITY)
Admission: EM | Admit: 2019-12-31 | Discharge: 2019-12-31 | Disposition: A | Discharge status: Home / Self Care | Payer: Self-pay | Attending: Emergency Medicine | Admitting: Emergency Medicine

## 2019-12-31 DIAGNOSIS — M5441 Lumbago with sciatica, right side: Secondary | ICD-10-CM

## 2019-12-31 DIAGNOSIS — F17210 Nicotine dependence, cigarettes, uncomplicated: Secondary | ICD-10-CM | POA: Insufficient documentation

## 2019-12-31 MED ORDER — IBUPROFEN 400 MG PO TABS
600.0000 mg | ORAL_TABLET | Freq: Once | ORAL | Status: AC
  Administered 2019-12-31: 600 mg via ORAL
  Filled 2019-12-31: qty 1

## 2019-12-31 MED ORDER — IBUPROFEN 600 MG PO TABS: 600.0000 mg | ORAL_TABLET | Freq: Four times a day (QID) | ORAL | 0 refills | Status: DC | PRN

## 2019-12-31 MED ORDER — OXYCODONE-ACETAMINOPHEN 5-325 MG PO TABS
1.0000 | ORAL_TABLET | Freq: Once | ORAL | Status: AC
  Administered 2019-12-31: 1 via ORAL
  Filled 2019-12-31: qty 1

## 2019-12-31 MED ORDER — OXYCODONE-ACETAMINOPHEN 5-325 MG PO TABS: 1.0000 | ORAL_TABLET | ORAL | 0 refills | Status: DC | PRN

## 2019-12-31 MED ORDER — CYCLOBENZAPRINE HCL 10 MG PO TABS: 10.0000 mg | ORAL_TABLET | Freq: Three times a day (TID) | ORAL | 0 refills | Status: DC

## 2019-12-31 MED ORDER — CYCLOBENZAPRINE HCL 10 MG PO TABS
10.0000 mg | ORAL_TABLET | Freq: Once | ORAL | Status: AC
  Administered 2019-12-31: 10 mg via ORAL
  Filled 2019-12-31: qty 1

## 2019-12-31 NOTE — ED Notes (Signed)
Patient discharge instructions reviewed with pt caregiver. Discussed s/sx to return, PCP follow up, medications given/next dose due, and prescriptions. Caregiver verbalized understanding.   °

## 2019-12-31 NOTE — ED Provider Notes (Signed)
[facility] EMERGENCY DEPARTMENT Provider Note   CSN: [id number] Arrival date & time: 12/31/19  0011     History Chief Complaint  Patient presents with  . Sciatica    Alex Cain is a 34 y.o. male.  Patient to ED with lumbar back pain that started 3 days ago. No known injury. No history of recurrent back pain. The pain started in the midline low back as a mild pain, and tonight became significantly increased, moving to bilateral lower upper buttocks. On the right, the pain radiates to the mid-posterior thigh. It is worse by movement, but is present at rest also. No urinary or bowel dysfunction. No weakness. No abdominal pain but he reports since the back pain intensified, he feels his abdomen in bloated. Last bowel movement yesterday morning, per his usual. He has not tried taking anything for symptoms.   The history is provided by the patient. No language interpreter was used.       Past Medical History:  Diagnosis Date  . Major depressive disorder     There are no problems to display for this patient.   History reviewed. No pertinent surgical history.     No family history on file.  Social History   Tobacco Use  . Smoking status: Current Every Day Smoker    Packs/day: 0.50    Types: Cigarettes  . Smokeless tobacco: Never Used  Substance Use Topics  . Alcohol use: Yes    Comment: socially  . Drug use: No    Home Medications Prior to Admission medications   Medication Sig Start Date End Date Taking? Authorizing Provider  Aspirin-Salicylamide-Caffeine (BC HEADACHE POWDER PO) Take 1 packet by mouth 2 (two) times daily as needed (headache).    [provider]  cyclobenzaprine (FLEXERIL) 10 MG tablet Take 1 tablet (10 mg total) by mouth 2 (two) times daily as needed for muscle spasms. 06/17/16   Lysbeth Penner, FNP  naproxen (NAPROSYN) 500 MG tablet Take 1 tablet (500 mg total) by mouth 2 (two) times daily with a meal.  06/17/16   Oxford, Orson Ape, FNP    Allergies    Mucinex [guaifenesin er]  Review of Systems   Review of Systems  Gastrointestinal:       See HPI, "bloated"  Genitourinary: Negative.  Negative for enuresis.  Musculoskeletal:       See HPI  Skin: Negative.  Negative for color change and wound.  Neurological: Negative.  Negative for weakness.    Physical Exam Updated Vital Signs BP 139/70 (BP Location: Left Arm)   Pulse 63   Temp 98.3 F (36.8 C) (Oral)   Resp 18   Ht 5\' 9"  (1.753 m)   Wt 65.8 kg   SpO2 100%   BMI 21.41 kg/m   Physical Exam Constitutional:      Appearance: He is well-developed.  Pulmonary:     Effort: Pulmonary effort is normal.  Abdominal:     General: There is no distension.     Palpations: Abdomen is soft.     Tenderness: There is no abdominal tenderness.  Musculoskeletal:        General: Normal range of motion.     Cervical back: Normal range of motion.     Lumbar back: No swelling. Negative right straight leg raise test and negative left straight leg raise test.       Back:  Skin:    General: Skin is warm and dry.  Neurological:  Mental Status: He is alert and oriented to person, place, and time.     Sensory: No sensory deficit.     Deep Tendon Reflexes: Reflexes normal.     ED Results / Procedures / Treatments   Labs (all labs ordered are listed, but only abnormal results are displayed) Labs Reviewed - No data to display  EKG None  Radiology No results found.  Procedures Procedures (including critical care time)  Medications Ordered in ED Medications - No data to display  ED Course  I have reviewed the triage vital signs and the nursing notes.  Pertinent labs & imaging results that were available during my care of the patient were reviewed by me and considered in my medical decision making (see chart for details).    MDM Rules/Calculators/A&P                      Patient to ED with low back pain as described in  HPI.   Low back on exam is tender without swelling or palpable spasm. No neurologic deficits on history or exam. Neurovascularly intact in LE's, equal reflexes.   Do not feel this is cauda equina, or other neurosurgical emergency based on history and exam. Recommend conservative treatment with medications, rest, heat. Ortho referral if pain does not improve over the next 2-3 days.   Final Clinical Impression(s) / ED Diagnoses Final diagnoses:  None   1. Low back pain  Rx / DC Orders ED Discharge Orders    None       Elpidio Anis, PA-C 12/31/19 0530    Zadie Rhine, MD 12/31/19 [phone]

## 2019-12-31 NOTE — ED Triage Notes (Signed)
Pt reports that he has been having lower back pain that shoots down his R leg for 3 days

## 2019-12-31 NOTE — ED Notes (Signed)
ED Provider at bedside. 

## 2019-12-31 NOTE — Discharge Instructions (Signed)
Rest the back (lying down, up to the bathroom only) for the next 2-3 days. Take medications as prescribed. Recommend warm compresses as instructed in your discharge papers.  If pain is no better in 3-4 days, call orthopedics to make an appointment for recheck.

## 2020-04-08 ENCOUNTER — Other Ambulatory Visit: Payer: Self-pay

## 2020-04-08 ENCOUNTER — Encounter (HOSPITAL_COMMUNITY): Payer: Self-pay | Admitting: Emergency Medicine

## 2020-04-08 ENCOUNTER — Emergency Department (HOSPITAL_COMMUNITY)
Admission: EM | Admit: 2020-04-08 | Discharge: 2020-04-09 | Disposition: A | Discharge status: Home / Self Care | Payer: Self-pay | Attending: Emergency Medicine | Admitting: Emergency Medicine

## 2020-04-08 DIAGNOSIS — F17210 Nicotine dependence, cigarettes, uncomplicated: Secondary | ICD-10-CM | POA: Insufficient documentation

## 2020-04-08 DIAGNOSIS — R300 Dysuria: Secondary | ICD-10-CM | POA: Insufficient documentation

## 2020-04-08 DIAGNOSIS — R369 Urethral discharge, unspecified: Secondary | ICD-10-CM | POA: Insufficient documentation

## 2020-04-08 DIAGNOSIS — R350 Frequency of micturition: Secondary | ICD-10-CM | POA: Insufficient documentation

## 2020-04-08 DIAGNOSIS — Z7982 Long term (current) use of aspirin: Secondary | ICD-10-CM | POA: Insufficient documentation

## 2020-04-08 DIAGNOSIS — Z79899 Other long term (current) drug therapy: Secondary | ICD-10-CM | POA: Insufficient documentation

## 2020-04-08 LAB — URINALYSIS, ROUTINE W REFLEX MICROSCOPIC
Bacteria, UA: NONE SEEN
Bilirubin Urine: NEGATIVE
Glucose, UA: NEGATIVE mg/dL
Ketones, ur: NEGATIVE mg/dL
Nitrite: NEGATIVE
Protein, ur: 30 mg/dL — AB
RBC / HPF: >50 RBC/hpf — ABNORMAL HIGH (ref 0–5)
Specific Gravity, Urine: 1.026 (ref 1.005–1.030)
WBC, UA: >50 WBC/hpf — ABNORMAL HIGH (ref 0–5)
pH: 6 (ref 5.0–8.0)

## 2020-04-08 NOTE — ED Triage Notes (Signed)
Pt c/o pain and bleeding with urination x 1 day. Pt reports he is "heavily" concerned for STD.

## 2020-04-09 MED ORDER — LIDOCAINE HCL (PF) 1 % IJ SOLN
1.0000 mL | Freq: Once | INTRAMUSCULAR | Status: AC
  Administered 2020-04-09: 1 mL

## 2020-04-09 MED ORDER — CEFTRIAXONE SODIUM 500 MG IJ SOLR
500.0000 mg | Freq: Once | INTRAMUSCULAR | Status: AC
  Administered 2020-04-09: 500 mg via INTRAMUSCULAR
  Filled 2020-04-09: qty 500

## 2020-04-09 MED ORDER — DOXYCYCLINE HYCLATE 100 MG PO CAPS: 100.0000 mg | ORAL_CAPSULE | Freq: Two times a day (BID) | ORAL | 0 refills | Status: AC

## 2020-04-09 MED ORDER — DOXYCYCLINE HYCLATE 100 MG PO TABS
100.0000 mg | ORAL_TABLET | Freq: Once | ORAL | Status: AC
  Administered 2020-04-09: 100 mg via ORAL
  Filled 2020-04-09: qty 1

## 2020-04-09 MED ORDER — IBUPROFEN 800 MG PO TABS
800.0000 mg | ORAL_TABLET | Freq: Once | ORAL | Status: AC
  Administered 2020-04-09: 800 mg via ORAL
  Filled 2020-04-09: qty 1

## 2020-04-09 MED ORDER — LIDOCAINE HCL (PF) 1 % IJ SOLN
INTRAMUSCULAR | Status: AC
  Filled 2020-04-09: qty 30

## 2020-04-09 NOTE — ED Provider Notes (Signed)
[facility] EMERGENCY DEPARTMENT Provider Note   CSN: [id number] Arrival date & time: 04/08/20  2036     History Chief Complaint  Patient presents with   [facility] TRANSMITTED DISEASE    Maxemiliano Riel is a 34 y.o. male presenting for evaluation of penile discharge and dysuria.  Patient states his symptoms started yesterday.  He reports persistent penile discharge with occasional blood-tinged discharge.  He reports pain at the tip of his penis, worse with urination.  He also has urinary frequency.  He reports discomfort of the right inguinal region, states it feels swollen.  He denies fevers, chills, nausea, vomiting, abdominal pain.  He is sexually active with 3 partners, states they are all male.  He states he always uses a condom except one time when it broke.  He states none of his partners have symptoms.  He has been tested and treated for STDs in the past, none recently.  He states he has no other medical problems, takes no medications daily.  HPI     Past Medical History:  Diagnosis Date   Major depressive disorder     There are no problems to display for this patient.   History reviewed. No pertinent surgical history.     No family history on file.  Social History   Tobacco Use   Smoking status: Current Every Day Smoker    Packs/day: 0.50    Types: Cigarettes   Smokeless tobacco: Never Used  Substance Use Topics   Alcohol use: Yes    Comment: socially   Drug use: No    Home Medications Prior to Admission medications   Medication Sig Start Date End Date Taking? Authorizing Provider  Aspirin-Salicylamide-Caffeine (BC HEADACHE POWDER PO) Take 1 packet by mouth 2 (two) times daily as needed (headache).    [provider]  cyclobenzaprine (FLEXERIL) 10 MG tablet Take 1 tablet (10 mg total) by mouth 3 (three) times daily. 12/31/19   Elpidio Anis, PA-C  doxycycline (VIBRAMYCIN) 100 MG capsule Take 1 capsule (100 mg  total) by mouth 2 (two) times daily for 7 days. 04/09/20 04/16/20  Monta Police, PA-C  ibuprofen (ADVIL) 600 MG tablet Take 1 tablet (600 mg total) by mouth every 6 (six) hours as needed. 12/31/19   Elpidio Anis, PA-C  naproxen (NAPROSYN) 500 MG tablet Take 1 tablet (500 mg total) by mouth 2 (two) times daily with a meal. 06/17/16   Deatra Canter, FNP  oxyCODONE-acetaminophen (PERCOCET/ROXICET) 5-325 MG tablet Take 1 tablet by mouth every 4 (four) hours as needed for severe pain. 12/31/19   Elpidio Anis, PA-C    Allergies    Mucinex [guaifenesin er]  Review of Systems   Review of Systems  Constitutional: Negative for fever.  Genitourinary: Positive for discharge, dysuria, frequency and penile pain.    Physical Exam Updated Vital Signs BP 116/78    Pulse 66    Temp 98.8 F (37.1 C) (Oral)    Resp 16    SpO2 99%   Physical Exam Vitals and nursing note reviewed. Exam conducted with a chaperone present.  Constitutional:      General: He is not in acute distress.    Appearance: He is well-developed.     Comments: Resting in the bed in no acute distress  HENT:     Head: Normocephalic and atraumatic.  Eyes:     Conjunctiva/sclera: Conjunctivae normal.     Pupils: Pupils are equal, round, and reactive to light.  Cardiovascular:  Rate and Rhythm: Normal rate and regular rhythm.     Pulses: Normal pulses.  Pulmonary:     Effort: Pulmonary effort is normal. No respiratory distress.     Breath sounds: Normal breath sounds. No wheezing.  Abdominal:     General: There is no distension.     Palpations: Abdomen is soft. There is no mass.     Tenderness: There is no abdominal tenderness. There is no guarding or rebound.  Genitourinary:    Penis: Circumcised. Discharge present.      Testes: Normal.     Epididymis:     Left: Normal.     Comments: White discharge coming from the tip of the penis.  No lesions or ulcerations.  No tenderness along the epididymitis, no swelling or  tenderness of the testicles.  Right sided inguinal lymphadenopathy, none on the left side. Musculoskeletal:        General: Normal range of motion.     Cervical back: Normal range of motion and neck supple.  Lymphadenopathy:     Lower Body: Right inguinal adenopathy present.  Skin:    General: Skin is warm and dry.  Neurological:     Mental Status: He is alert and oriented to person, place, and time.     ED Results / Procedures / Treatments   Labs (all labs ordered are listed, but only abnormal results are displayed) Labs Reviewed  URINALYSIS, ROUTINE W REFLEX MICROSCOPIC - Abnormal; Notable for the following components:      Result Value   APPearance CLOUDY (*)    Hgb urine dipstick SMALL (*)    Protein, ur 30 (*)    Leukocytes,Ua LARGE (*)    RBC / HPF >50 (*)    WBC, UA >50 (*)    All other components within normal limits  URINE CULTURE  GC/CHLAMYDIA PROBE AMP ([place]) NOT AT [facility]    EKG None  Radiology No results found.  Procedures Procedures (including critical care time)  Medications Ordered in ED Medications  lidocaine (PF) (XYLOCAINE) 1 % injection (has no administration in time range)  ibuprofen (ADVIL) tablet 800 mg (800 mg Oral Given 04/09/20 0829)  cefTRIAXone (ROCEPHIN) injection 500 mg (500 mg Intramuscular Given 04/09/20 0830)  lidocaine (PF) (XYLOCAINE) 1 % injection 1 mL (1 mL Other Given 04/09/20 0830)  doxycycline (VIBRA-TABS) tablet 100 mg (100 mg Oral Given 04/09/20 1245)    ED Course  I have reviewed the triage vital signs and the nursing notes.  Pertinent labs & imaging results that were available during my care of the patient were reviewed by me and considered in my medical decision making (see chart for details).    MDM Rules/Calculators/A&P                          Patient presenting for evaluation of penile discharge.  On exam, he appears nontoxic.  He does have right-sided inguinal lymphadenopathy and penile discharge on exam.  Likely  STD.  He will receive empiric treatment for gonorrhea and chlamydia.  Offered testing for HIV and syphilis, patient declined.  Patient without lesions, as such doubt LGV.  Urine obtained from triage shows large leukocytes, greater than 50 white cells, and small blood.  Patient without CVA tenderness or abdominal pain, doubt kidney stone.  Doubt UTI in the setting of penile discharge, favor STD.  Discussed with patient.  Discussed importance of abstaining from sex until he and his partners have been  treated.  Discussed importance of using condoms.  At this time, patient appears safe for discharge.  Return precautions given. patient states he understands and agrees to plan.  Final Clinical Impression(s) / ED Diagnoses Final diagnoses:  Penile discharge    Rx / DC Orders ED Discharge Orders         Ordered    doxycycline (VIBRAMYCIN) 100 MG capsule  2 times daily        04/09/20 0811           Eulanda Dorion, PA-C 04/09/20 2952    Gwyneth Sprout, MD 04/09/20 1552

## 2020-04-09 NOTE — ED Notes (Signed)
Pt d/c home per MD order. Discharge summary reviewed with pt, pt verbalizes understanding. Ambulatory off unit. No s/s of acute distress noted.  

## 2020-04-09 NOTE — Discharge Instructions (Signed)
You were treated for gonorrhea and chlamydia today.  Your tests are pending. If positive, you will receive a phone call. If negative, you will not. Either way, you may check online on my chart.  Continue taking ibuprofen as needed for pain.  Follow up with your primary care doctor as needed for further management.  Return to the ER with any new, worsening, or concerning symptoms.

## 2020-04-10 LAB — URINE CULTURE: Culture: NO GROWTH

## 2020-04-10 LAB — GC/CHLAMYDIA PROBE AMP (~~LOC~~) NOT AT ARMC
Chlamydia: POSITIVE — AB
Comment: NEGATIVE
Comment: NORMAL
Neisseria Gonorrhea: POSITIVE — AB

## 2020-09-20 ENCOUNTER — Encounter (HOSPITAL_COMMUNITY): Payer: Self-pay

## 2020-09-20 ENCOUNTER — Ambulatory Visit (HOSPITAL_COMMUNITY)
Admission: EM | Admit: 2020-09-20 | Discharge: 2020-09-20 | Disposition: A | Discharge status: Home / Self Care | Payer: Self-pay

## 2020-09-20 DIAGNOSIS — M5442 Lumbago with sciatica, left side: Secondary | ICD-10-CM

## 2020-09-20 MED ORDER — PREDNISONE 20 MG PO TABS: ORAL_TABLET | ORAL | 0 refills | Status: AC

## 2020-09-20 MED ORDER — KETOROLAC TROMETHAMINE 30 MG/ML IJ SOLN
INTRAMUSCULAR | Status: AC
  Filled 2020-09-20: qty 1

## 2020-09-20 MED ORDER — KETOROLAC TROMETHAMINE 30 MG/ML IJ SOLN
30.0000 mg | Freq: Once | INTRAMUSCULAR | Status: AC
  Administered 2020-09-20: 30 mg via INTRAMUSCULAR

## 2020-09-20 NOTE — ED Triage Notes (Signed)
Pt presents with lower back pain, radiates to left leg x 4 days. Ibuprofen gives some relief.   Pt requested crutches.

## 2020-09-20 NOTE — Discharge Instructions (Signed)
Toradol injection in office today.  Please start prednisone pack as directed.  Crutches as needed.  As discussed, please follow-up with PCP/orthopedics for further evaluation for possible herniated disc versus sciatica.  If having numbness/tingling to the inner thighs, loss of bladder or bowel control, go to the emergency department for further evaluation.

## 2020-09-20 NOTE — ED Provider Notes (Signed)
[facility]    CSN: [id number] Arrival date & time: 09/20/20  0825      History   Chief Complaint Chief Complaint  Patient presents with  . Back Pain    HPI Alex Cain is a 35 y.o. male.   35 year old male comes in for 4-day history of low back pain radiating to left leg.  Denies obvious injury/trauma.  However, for the past few days, has had increased lifting/activity due to moving.  Pain is constant, worse with ambulation.  States now having trouble weightbearing on the left leg due to pain.  Denies saddle anesthesia, loss of bladder or bowel control.  Denies numbness, tingling.  Ibuprofen with some relief.  History of sciatica.     Past Medical History:  Diagnosis Date  . Major depressive disorder     There are no problems to display for this patient.   History reviewed. No pertinent surgical history.     Home Medications    Prior to Admission medications   Medication Sig Start Date End Date Taking? Authorizing Provider  ibuprofen (ADVIL) 200 MG tablet Take 200 mg by mouth every 6 (six) hours as needed.   Yes [provider]  predniSONE (DELTASONE) 20 MG tablet Take 3 tablets (60 mg total) by mouth daily with breakfast for 3 days, THEN 2 tablets (40 mg total) daily with breakfast for 3 days, THEN 1 tablet (20 mg total) daily with breakfast for 3 days. 09/20/20 09/29/20 Yes Belinda Fisher, PA-C    Family History History reviewed. No pertinent family history.  Social History Social History   Tobacco Use  . Smoking status: Current Every Day Smoker    Packs/day: 0.50    Types: Cigarettes  . Smokeless tobacco: Never Used  Substance Use Topics  . Alcohol use: Yes    Comment: socially  . Drug use: No     Allergies   Mucinex [guaifenesin er]   Review of Systems Review of Systems  Reason unable to perform ROS: See HPI as above.     Physical Exam Triage Vital Signs ED Triage Vitals  Enc Vitals Group     BP 09/20/20 0844  106/70     Pulse Rate 09/20/20 0844 73     Resp 09/20/20 0844 18     Temp 09/20/20 0844 98.3 F (36.8 C)     Temp Source 09/20/20 0844 Oral     SpO2 09/20/20 0844 96 %     Weight --      Height --      Head Circumference --      Peak Flow --      Pain Score 09/20/20 0843 10     Pain Loc --      Pain Edu? --      Excl. in GC? --    No data found.  Updated Vital Signs BP 106/70 (BP Location: Left Arm)   Pulse 73   Temp 98.3 F (36.8 C) (Oral)   Resp 18   SpO2 96%   Physical Exam Constitutional:      General: He is not in acute distress.    Appearance: He is well-developed and well-nourished. He is not diaphoretic.  HENT:     Head: Normocephalic and atraumatic.  Eyes:     Conjunctiva/sclera: Conjunctivae normal.     Pupils: Pupils are equal, round, and reactive to light.  Cardiovascular:     Rate and Rhythm: Normal rate and regular rhythm.  Heart sounds: Normal heart sounds. No murmur heard. No friction rub. No gallop.   Pulmonary:     Effort: Pulmonary effort is normal. No accessory muscle usage or respiratory distress.     Breath sounds: Normal breath sounds. No stridor. No decreased breath sounds, wheezing, rhonchi or rales.  Musculoskeletal:     Comments: No rashes, erythema, contusion seen.  No tenderness to palpation of the spinous processes.  No tenderness to palpation of lumbar back/hip. Decreased flexion of back due to pain. Full active and passive ROM of hip.  Strength and sensation intact distally.  Positive left straight leg raise.  Skin:    General: Skin is warm and dry.  Neurological:     Mental Status: He is alert and oriented to person, place, and time.      UC Treatments / Results  Labs (all labs ordered are listed, but only abnormal results are displayed) Labs Reviewed - No data to display  EKG   Radiology No results found.  Procedures Procedures (including critical care time)  Medications Ordered in UC Medications  ketorolac  (TORADOL) 30 MG/ML injection 30 mg (30 mg Intramuscular Given 09/20/20 0925)    Initial Impression / Assessment and Plan / UC Course  I have reviewed the triage vital signs and the nursing notes.  Pertinent labs & imaging results that were available during my care of the patient were reviewed by me and considered in my medical decision making (see chart for details).    Discussed possible sciatica versus herniated disc.  Toradol injection in office today.  Will start prednisone pack as directed.  Patient requesting crutches, provided.  Return precautions given.  Otherwise Ortho follow-up.  Final Clinical Impressions(s) / UC Diagnoses   Final diagnoses:  Acute left-sided low back pain with left-sided sciatica   ED Prescriptions    Medication Sig Dispense Auth. Provider   predniSONE (DELTASONE) 20 MG tablet Take 3 tablets (60 mg total) by mouth daily with breakfast for 3 days, THEN 2 tablets (40 mg total) daily with breakfast for 3 days, THEN 1 tablet (20 mg total) daily with breakfast for 3 days. 18 tablet Belinda Fisher, PA-C     PDMP not reviewed this encounter.   Belinda Fisher, PA-C 09/20/20 [phone]

## 2021-06-25 ENCOUNTER — Ambulatory Visit: Payer: Self-pay

## 2021-09-25 ENCOUNTER — Encounter (HOSPITAL_COMMUNITY): Payer: Self-pay | Admitting: Emergency Medicine

## 2021-09-25 ENCOUNTER — Ambulatory Visit (INDEPENDENT_AMBULATORY_CARE_PROVIDER_SITE_OTHER): Payer: Self-pay

## 2021-09-25 ENCOUNTER — Ambulatory Visit (HOSPITAL_COMMUNITY)
Admission: EM | Admit: 2021-09-25 | Discharge: 2021-09-25 | Disposition: A | Discharge status: Home / Self Care | Payer: Self-pay | Attending: Urgent Care | Admitting: Urgent Care

## 2021-09-25 ENCOUNTER — Other Ambulatory Visit: Payer: Self-pay

## 2021-09-25 DIAGNOSIS — M5010 Cervical disc disorder with radiculopathy, unspecified cervical region: Secondary | ICD-10-CM

## 2021-09-25 DIAGNOSIS — M5412 Radiculopathy, cervical region: Secondary | ICD-10-CM

## 2021-09-25 DIAGNOSIS — M542 Cervicalgia: Secondary | ICD-10-CM

## 2021-09-25 MED ORDER — GABAPENTIN 100 MG PO CAPS: 100.0000 mg | ORAL_CAPSULE | Freq: Three times a day (TID) | ORAL | 0 refills | Status: AC

## 2021-09-25 MED ORDER — CARISOPRODOL 350 MG PO TABS: 350.0000 mg | ORAL_TABLET | Freq: Three times a day (TID) | ORAL | 0 refills | Status: AC

## 2021-09-25 NOTE — ED Triage Notes (Signed)
Pt c.o neck pain for 2 weeks. Reports that will have numbness in left arm that radiates to his fingers with it. Denies injury, fall, or lifting.

## 2021-09-25 NOTE — Discharge Instructions (Addendum)
Your neck x-ray is abnormal. There is concern for a disc abnormality in your neck at C5-6, and C6-7. This may be the cause of your left arm pain. Please start taking carisoprodol 3 times daily as tolerated for the next 5 days.  It is a muscle relaxer, it may make you sleepy. If no improvement to the numbness in your left arm, then also start taking gabapentin, up to 3 times daily.  This medication may also make you sleepy. Please call the specialist and schedule a follow-up immediately. Physical therapy may be of use. Apply moist heat to the neck to help relax the muscles.
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
## 2021-10-09 ENCOUNTER — Encounter (HOSPITAL_COMMUNITY): Payer: Self-pay

## 2021-10-09 ENCOUNTER — Ambulatory Visit (INDEPENDENT_AMBULATORY_CARE_PROVIDER_SITE_OTHER): Payer: Self-pay

## 2021-10-09 ENCOUNTER — Other Ambulatory Visit: Payer: Self-pay

## 2021-10-09 ENCOUNTER — Ambulatory Visit (HOSPITAL_COMMUNITY)
Admission: EM | Admit: 2021-10-09 | Discharge: 2021-10-09 | Disposition: A | Discharge status: Home / Self Care | Payer: Self-pay | Attending: Family Medicine | Admitting: Family Medicine

## 2021-10-09 DIAGNOSIS — R079 Chest pain, unspecified: Secondary | ICD-10-CM

## 2021-10-09 DIAGNOSIS — R0602 Shortness of breath: Secondary | ICD-10-CM

## 2021-10-09 DIAGNOSIS — R0789 Other chest pain: Secondary | ICD-10-CM

## 2021-10-09 MED ORDER — PREDNISONE 20 MG PO TABS: 40.0000 mg | ORAL_TABLET | Freq: Every day | ORAL | 0 refills | Status: DC

## 2021-10-09 NOTE — ED Triage Notes (Signed)
Pt reports chest pain x 3 days

## 2021-10-12 NOTE — ED Provider Notes (Signed)
?[facility] ? ? ?[id number] ?10/09/21 Arrival Time: 1922 ? ?ASSESSMENT & PLAN: ? ?1. Chest pain, unspecified type   ?2. Chest wall pain   ? ? ?Patient history and exam consistent with non-cardiac cause of chest pain. ? ?ECG: ?Performed today and interpreted by me: normal EKG, normal sinus rhythm. No STEMI. ? ?I have personally viewed the imaging studies ordered this visit. ?No acute changes. No pneumothorax. ? ?Begin trial of: ?Meds ordered this encounter  ?Medications  ? predniSONE (DELTASONE) 20 MG tablet  ?  Sig: Take 2 tablets (40 mg total) by mouth daily.  ?  Dispense:  14 tablet  ?  Refill:  0  ? ? ? ?Chest pain precautions given. ?Reviewed expectations re: course of current medical issues. Questions answered. ?Outlined signs and symptoms indicating need for more acute intervention. ?Patient verbalized understanding. ?After Visit Summary given. ? ? ?SUBJECTIVE: ? ?History from: patient. ?Alex Cain is a 36 y.o. male who presents with complaint of generalized anterior non-radiating chest pain; gradual onset; x 3 days; mostly there but comes and goes. Without any SOB/n/v/diaphoreses. No injuries reported. Denies: exertional chest pressure/discomfort, fatigue, irregular heart beat, lower extremity edema, near-syncope, orthopnea, palpitations, paroxysmal nocturnal dyspnea, and syncope. ?Aggravating factors: include certain movements and lifting heavy items. Alleviating factors: have not been identified. ?Recent illnesses: none. Fever: absent. ?Ambulatory without assistance. ?Self/OTC treatment: none. ?History of similar: no. ?Recreational drug use: denied. ? ?Social History  ? ?Tobacco Use  ?Smoking Status Every Day  ? Packs/day: 0.50  ? Types: Cigarettes  ?Smokeless Tobacco Never  ? ?Social History  ? ?Substance and Sexual Activity  ?Alcohol Use Yes  ? Comment: socially  ? ? ?OBJECTIVE: ? ?Vitals:  ? 10/09/21 1954  ?BP: 122/65  ?Pulse: 86  ?Resp: 18  ?Temp: 98.7 ?F (37.1 ?C)  ?SpO2: 100%  ?   ?General appearance: alert, oriented, no acute distress ?Eyes: PERRLA; EOMI; conjunctivae normal ?HENT: normocephalic; atraumatic ?Neck: supple with FROM ?Lungs: without labored respirations; speaks full sentences without difficulty; CTAB ?Heart: regular rate and rhythm without murmer ?Chest Wall: without tenderness to palpation ?Abdomen: soft, non-tender; no guarding or rebound tenderness ?Extremities: without edema; without calf swelling or tenderness; symmetrical without gross deformities ?Skin: warm and dry; without rash or lesions ?Neuro: normal gait ?Psychological: alert and cooperative; normal mood and affect ? ? ?Imaging: ?DG Chest 2 View ? ?Result Date: 10/09/2021 ?CLINICAL DATA:  Chest pain short of breath EXAM: CHEST - 2 VIEW COMPARISON:  04/27/2016 FINDINGS: The heart size and mediastinal contours are within normal limits. Both lungs are clear. The visualized skeletal structures are unremarkable. IMPRESSION: No active cardiopulmonary disease. Electronically Signed   By: Donavan Foil M.D.   On: 10/09/2021 20:14  ? ? ? ?Allergies  ?Allergen Reactions  ? Mucinex [Guaifenesin Er] Hives  ? ? ?Past Medical History:  ?Diagnosis Date  ? Major depressive disorder   ? ?Social History  ? ?Socioeconomic History  ? Marital status: Married  ?  Spouse name: Not on file  ? Number of children: Not on file  ? Years of education: Not on file  ? Highest education level: Not on file  ?Occupational History  ? Not on file  ?Tobacco Use  ? Smoking status: Every Day  ?  Packs/day: 0.50  ?  Types: Cigarettes  ? Smokeless tobacco: Never  ?Substance and Sexual Activity  ? Alcohol use: Yes  ?  Comment: socially  ? Drug use: No  ? Sexual activity: Not on file  ?  Other Topics Concern  ? Not on file  ?Social History Narrative  ? Not on file  ? ?Social Determinants of Health  ? ?Financial Resource Strain: Not on file  ?Food Insecurity: Not on file  ?Transportation Needs: Not on file  ?Physical Activity: Not on file  ?Stress: Not on file   ?Social Connections: Not on file  ?Intimate Partner Violence: Not on file  ? ?History reviewed. No pertinent family history. ?History reviewed. No pertinent surgical history. ? ?  ?Vanessa Kick, MD ?10/12/21 1003 ? ?

## 2021-10-24 ENCOUNTER — Emergency Department (HOSPITAL_COMMUNITY): Payer: Self-pay

## 2021-10-24 ENCOUNTER — Emergency Department (HOSPITAL_COMMUNITY)
Admission: EM | Admit: 2021-10-24 | Discharge: 2021-10-24 | Disposition: A | Discharge status: Home / Self Care | Payer: Self-pay | Attending: Emergency Medicine | Admitting: Emergency Medicine

## 2021-10-24 ENCOUNTER — Encounter (HOSPITAL_COMMUNITY): Payer: Self-pay

## 2021-10-24 ENCOUNTER — Other Ambulatory Visit: Payer: Self-pay

## 2021-10-24 DIAGNOSIS — J439 Emphysema, unspecified: Secondary | ICD-10-CM | POA: Insufficient documentation

## 2021-10-24 DIAGNOSIS — M542 Cervicalgia: Secondary | ICD-10-CM

## 2021-10-24 DIAGNOSIS — M5412 Radiculopathy, cervical region: Secondary | ICD-10-CM | POA: Insufficient documentation

## 2021-10-24 DIAGNOSIS — F17210 Nicotine dependence, cigarettes, uncomplicated: Secondary | ICD-10-CM | POA: Insufficient documentation

## 2021-10-24 MED ORDER — CYCLOBENZAPRINE HCL 10 MG PO TABS: 10.0000 mg | ORAL_TABLET | Freq: Every evening | ORAL | 0 refills | Status: AC | PRN

## 2021-10-24 MED ORDER — OXYCODONE-ACETAMINOPHEN 5-325 MG PO TABS
1.0000 | ORAL_TABLET | Freq: Once | ORAL | Status: AC
  Administered 2021-10-24: 1 via ORAL
  Filled 2021-10-24: qty 1

## 2021-10-24 MED ORDER — PREDNISONE 10 MG (21) PO TBPK: ORAL_TABLET | Freq: Every day | ORAL | 0 refills | Status: AC

## 2021-10-24 NOTE — ED Triage Notes (Signed)
Pt reports neck and left shoulder pain and stiffness for awhile now. Pt reports going to PCP and being prescribed gabapentin and another medication and denies any relief. Denies injury.  ?
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
## 2021-10-24 NOTE — Discharge Instructions (Signed)
I am prescribing you a strong steroid medication called prednisone.  Please only take this as prescribed.  Please take it early in the morning, as this medication can be stimulating and make it difficult to sleep at night. ? ?I am prescribing you a strong muscle relaxer called flexeril. Please only take this medication once in the evening with dinner. This medication can make you quite drowsy. Do not mix it with alcohol. Do not drive a vehicle after taking it.  ? ?Like we discussed, I have given you a referral to [facility] community health and wellness.  They can act as your primary care provider.  I would like you to follow-up with them regarding your recurrent neck pain as well as your smoking. ? ?If you develop any new or worsening symptoms please come back to the emergency department immediately. ?

## 2021-10-24 NOTE — ED Provider Triage Note (Signed)
Emergency Medicine Provider Triage Evaluation Note ? ?Alex Cain , a 36 y.o. male  was evaluated in triage.  Pt complains of severe cervical neck pain with left arm numbness for several months.  He reports he was seen in urgent care, diagnosed with presumed radiculopathy, and given muscle relaxant, gabapentin, and prednisone.  He has been taking the gabapentin and muscle relaxant as prescribed with very minimal relief.  He did not take the prednisone due to a bad experience in the past with a pinched nerve in his lower back.  He reports that he has left arm numbness that is very persistent, painful especially in certain positions.  He has tried some PT exercises, and had a first appointment at a spine specialist, he has not had any advanced imaging despite months of ongoing pain, numbness.  No IV drug use, chronic corticosteroid use, history of cancer, pain waking patient up from sleep. ? ?Review of Systems  ?Positive: Neck pain, arm numness ?Negative: Saddle anesthesia, weakness ? ?Physical Exam  ?BP 132/74 (BP Location: Left Arm)   Pulse 85   Temp 98 ?F (36.7 ?C) (Oral)   Resp 16   SpO2 97%  ?Gen:   Awake, no distress ?Resp:  Normal effort  ?MSK:   Moves extremities without difficulty  ?Other:  Tight Paraspinous muscles c spine, some midline tenderness, no decreased strength left compared to right ? ?Medical Decision Making  ?Medically screening exam initiated at 3:45 PM.  Appropriate orders placed.  Alex Cain was informed that the remainder of the evaluation will be completed by another provider, this initial triage assessment does not replace that evaluation, and the importance of remaining in the ED until their evaluation is complete. ? ?Workup initiated ?  ?Alex Pickler, PA-C ?10/24/21 1547 ? ?

## 2022-04-14 ENCOUNTER — Emergency Department (HOSPITAL_COMMUNITY): Payer: Self-pay

## 2022-04-14 ENCOUNTER — Emergency Department (HOSPITAL_COMMUNITY)
Admission: EM | Admit: 2022-04-14 | Discharge: 2022-04-14 | Disposition: A | Discharge status: Home / Self Care | Payer: Self-pay | Attending: Emergency Medicine | Admitting: Emergency Medicine

## 2022-04-14 ENCOUNTER — Encounter (HOSPITAL_COMMUNITY): Payer: Self-pay | Admitting: Emergency Medicine

## 2022-04-14 DIAGNOSIS — F1210 Cannabis abuse, uncomplicated: Secondary | ICD-10-CM | POA: Insufficient documentation

## 2022-04-14 DIAGNOSIS — R002 Palpitations: Secondary | ICD-10-CM | POA: Insufficient documentation

## 2022-04-14 DIAGNOSIS — R0602 Shortness of breath: Secondary | ICD-10-CM | POA: Insufficient documentation

## 2022-04-14 DIAGNOSIS — R42 Dizziness and giddiness: Secondary | ICD-10-CM | POA: Insufficient documentation

## 2022-04-14 LAB — URINALYSIS, ROUTINE W REFLEX MICROSCOPIC
Bacteria, UA: NONE SEEN
Bilirubin Urine: NEGATIVE
Glucose, UA: NEGATIVE mg/dL
Ketones, ur: NEGATIVE mg/dL
Leukocytes,Ua: NEGATIVE
Nitrite: NEGATIVE
Protein, ur: NEGATIVE mg/dL
Specific Gravity, Urine: 1.017 (ref 1.005–1.030)
pH: 5 (ref 5.0–8.0)

## 2022-04-14 LAB — RAPID URINE DRUG SCREEN, HOSP PERFORMED
Amphetamines: NOT DETECTED
Barbiturates: NOT DETECTED
Benzodiazepines: NOT DETECTED
Cocaine: NOT DETECTED
Opiates: NOT DETECTED
Tetrahydrocannabinol: POSITIVE — AB

## 2022-04-14 LAB — CBC
HCT: 46.5 % (ref 39.0–52.0)
Hemoglobin: 15.1 g/dL (ref 13.0–17.0)
MCH: 30.9 pg (ref 26.0–34.0)
MCHC: 32.5 g/dL (ref 30.0–36.0)
MCV: 95.1 fL (ref 80.0–100.0)
Platelets: 202 10*3/uL (ref 150–400)
RBC: 4.89 MIL/uL (ref 4.22–5.81)
RDW: 13.2 % (ref 11.5–15.5)
WBC: 8.4 10*3/uL (ref 4.0–10.5)
nRBC: 0 % (ref 0.0–0.2)

## 2022-04-14 LAB — BASIC METABOLIC PANEL
Anion gap: 8 (ref 5–15)
BUN: 13 mg/dL (ref 6–20)
CO2: 29 mmol/L (ref 22–32)
Calcium: 9.1 mg/dL (ref 8.9–10.3)
Chloride: 101 mmol/L (ref 98–111)
Creatinine, Ser: 1.15 mg/dL (ref 0.61–1.24)
GFR, Estimated: >60 mL/min (ref >60)
Glucose, Bld: 84 mg/dL (ref 70–99)
Potassium: 4 mmol/L (ref 3.5–5.1)
Sodium: 138 mmol/L (ref 135–145)

## 2022-04-14 LAB — TROPONIN I (HIGH SENSITIVITY): Troponin I (High Sensitivity): 3 ng/L (ref <18)

## 2022-04-14 MED ORDER — LACTATED RINGERS IV BOLUS
1000.0000 mL | Freq: Once | INTRAVENOUS | Status: AC
  Administered 2022-04-14: 1000 mL via INTRAVENOUS

## 2022-04-14 NOTE — ED Provider Notes (Signed)
[facility]-EMERGENCY DEPT Provider Note   CSN: [id number] Arrival date & time: 04/14/22  1205     History {Add pertinent medical, surgical, social history, OB history to HPI:1} Chief Complaint  Patient presents with   Shortness of Breath    Alex Cain is a 36 y.o. male.  36 year old male with a history of daily marijuana use who presents to the emergency department with dizziness, palpitations, and shortness of breath.  Patient states that when prompted him to come the emergency department was dizziness and palpitations that been present for the past 2 to 3 days.  He reports that dizziness is worse with tilting his head downwards as well as turning at the sides.  Also reports that he has occasional palpitations and has been dizzy at random.  No chest pain.  Says that the dizziness is not exertional.  Denies any hearing loss or tinnitus.  No double vision, dysphagia, or significant speech changes.  Says that he lost his brother recently and has been drinking more heavily than usual recently but is only several shots that time.  Denies any history of alcohol withdrawal.  Denies any chest pain.  No history of DVT/PE, cancer, arrhythmia, or family history of arrhythmia.  Denies any headaches.   Shortness of Breath      Home Medications Prior to Admission medications   Medication Sig Start Date End Date Taking? Authorizing Provider  cyclobenzaprine (FLEXERIL) 10 MG tablet Take 1 tablet (10 mg total) by mouth at bedtime as needed for muscle spasms. 10/24/21   Placido Sou, PA-C  gabapentin (NEURONTIN) 100 MG capsule Take 1 capsule (100 mg total) by mouth 3 (three) times daily. 09/25/21   Crain, Whitney L, PA  ibuprofen (ADVIL) 200 MG tablet Take 200 mg by mouth every 6 (six) hours as needed.    [provider]  predniSONE (STERAPRED UNI-PAK 21 TAB) 10 MG (21) TBPK tablet Take by mouth daily. Take 6 tabs by mouth daily  for 2 days, then 5 tabs for  2 days, then 4 tabs for 2 days, then 3 tabs for 2 days, 2 tabs for 2 days, then 1 tab by mouth daily for 2 days 10/24/21   Placido Sou, PA-C      Allergies    Mucinex [guaifenesin er]    Review of Systems   Review of Systems  Respiratory:  Positive for shortness of breath.     Physical Exam Updated Vital Signs BP 122/81 (BP Location: Left Arm)   Pulse (!) 59   Temp 98.4 F (36.9 C) (Oral)   Resp 18   SpO2 100%  Physical Exam Vitals and nursing note reviewed.  Constitutional:      General: He is not in acute distress.    Appearance: He is well-developed.  HENT:     Head: Normocephalic and atraumatic.     Right Ear: Tympanic membrane, ear canal and external ear normal.     Left Ear: Tympanic membrane, ear canal and external ear normal.     Nose: Nose normal.     Mouth/Throat:     Mouth: Mucous membranes are moist.     Pharynx: Oropharynx is clear.  Eyes:     Extraocular Movements: Extraocular movements intact.     Conjunctiva/sclera: Conjunctivae normal.     Pupils: Pupils are equal, round, and reactive to light.  Cardiovascular:     Rate and Rhythm: Normal rate and regular rhythm.     Heart sounds: Normal heart sounds.  Pulmonary:     Effort: Pulmonary effort is normal. No respiratory distress.     Breath sounds: Normal breath sounds.  Abdominal:     General: Abdomen is flat.     Palpations: Abdomen is soft.  Musculoskeletal:        General: No swelling.     Cervical back: Normal range of motion and neck supple.     Right lower leg: No edema.     Left lower leg: No edema.  Skin:    General: Skin is warm and dry.     Capillary Refill: Capillary refill takes less than 2 seconds.  Neurological:     Mental Status: He is alert. Mental status is at baseline.     Comments: MENTAL STATUS: AAOx3 CRANIAL NERVES: II: Pupils equal and reactive *** mm BL, no RAPD, no VF deficits III, IV, VI: EOM intact, no gaze preference or deviation, no nystagmus. V: normal  sensation to light touch in V1, V2, and V3 segments bilaterally VII: no facial weakness or asymmetry, no nasolabial fold flattening VIII: normal hearing to speech and finger friction IX, X: normal palatal elevation, no uvular deviation XI: 5/5 head turn and 5/5 shoulder shrug bilaterally XII: midline tongue protrusion MOTOR: 5/5 strength in R shoulder flexion, elbow flexion and extension, and grip strength. 5/5 strength in L shoulder flexion, elbow flexion and extension, and grip strength.  5/5 strength in R hip and knee flexion, knee extension, ankle plantar and dorsiflexion. 5/5 strength in L hip and knee flexion, knee extension, ankle plantar and dorsiflexion. SENSORY: Normal sensation to light touch in all extremities COORD: Normal finger to nose and heel to shin, no tremor, no dysmetria STATION: normal stance, no truncal ataxia GAIT: Normal   Psychiatric:        Mood and Affect: Mood normal.        Behavior: Behavior normal.     ED Results / Procedures / Treatments   Labs (all labs ordered are listed, but only abnormal results are displayed) Labs Reviewed  CBC  BASIC METABOLIC PANEL  URINALYSIS, ROUTINE W REFLEX MICROSCOPIC  RAPID URINE DRUG SCREEN, HOSP PERFORMED    EKG None  Radiology DG Chest 2 View  Result Date: 04/14/2022 CLINICAL DATA:  Short of breath EXAM: CHEST - 2 VIEW COMPARISON:  10/09/2021 FINDINGS: The heart size and mediastinal contours are within normal limits. Both lungs are clear. No pleural effusion or pneumothorax. The visualized skeletal structures are unremarkable. IMPRESSION: No active cardiopulmonary disease. Electronically Signed   By: Guadlupe Spanish M.D.   On: 04/14/2022 13:03   CT Head Wo Contrast  Result Date: 04/14/2022 CLINICAL DATA:  Mental status change, unknown cause EXAM: CT HEAD WITHOUT CONTRAST TECHNIQUE: Contiguous axial images were obtained from the base of the skull through the vertex without intravenous contrast. RADIATION DOSE  REDUCTION: This exam was performed according to the departmental dose-optimization program which includes automated exposure control, adjustment of the mA and/or kV according to patient size and/or use of iterative reconstruction technique. COMPARISON:  Head CT 03/04/2008 FINDINGS: Brain: No evidence of acute intracranial hemorrhage or extra-axial collection. No loss of gray-white matter differentiation.No evidence of mass lesion/concerning mass effect.The ventricles are normal in size. Vascular: No hyperdense vessel or unexpected calcification. Skull: Normal. Negative for fracture or focal lesion. Sinuses/Orbits: No acute finding. Other: None. IMPRESSION: No acute intracranial abnormality. Electronically Signed   By: Caprice Renshaw M.D.   On: 04/14/2022 12:55    Procedures Procedures  {Document cardiac monitor, telemetry  assessment procedure when appropriate:1}  Medications Ordered in ED Medications - No data to display  ED Course/ Medical Decision Making/ A&P Clinical Course as of 04/14/22 2209  Mon Apr 14, 2022  2122 CT head and x-ray of the chest reviewed and interpreted by me as showing no acute abnormality. [RP]    Clinical Course User Index [RP] Rondel Baton, MD                           Medical Decision Making Amount and/or Complexity of Data Reviewed Radiology: ordered.   ***  {Document critical care time when appropriate:1} {Document review of labs and clinical decision tools ie heart score, Chads2Vasc2 etc:1}  {Document your independent review of radiology images, and any outside records:1} {Document your discussion with family members, caretakers, and with consultants:1} {Document social determinants of health affecting pt's care:1} {Document your decision making why or why not admission, treatments were needed:1} Final Clinical Impression(s) / ED Diagnoses Final diagnoses:  None    Rx / DC Orders ED Discharge Orders     None

## 2022-04-14 NOTE — ED Triage Notes (Signed)
Patient c/o "feeling jittery" and SOB x2 days. Denies chest pain, cough, fever. Reports drinking alcohol x2 days ago. States smokes 0.5 pack of cigarettes/day. Reports smoking marijuana. Denies other drugs.

## 2022-04-14 NOTE — ED Provider Triage Note (Signed)
Emergency Medicine Provider Triage Evaluation Note  Alex Cain , a 36 y.o. male  was evaluated in triage.  Pt complains of feeling short of breath, chest tightness, dizzy, woozy, blurry vision, feeling like he is going to pass out.  Patient reports that he feels like his heart is racing for these events, or an electric surge in his head".  He denies headache at rest, nausea, vomiting, fever, chills.  He smokes half a pack of cigarettes a day, and marijuana, reports that he drinks casually but not heavily.  He denies any chest pain at this time. He denies other drug use.  Review of Systems  Positive: Shob, heart racing, wooziness, electric shocks in head Negative: Chest pain, fever, chills  Physical Exam  BP 104/89 (BP Location: Right Arm)   Pulse 64   Temp 98.5 F (36.9 C) (Oral)   Resp 18   SpO2 96%  Gen:   Awake, no distress   Resp:  Normal effort  MSK:   Moves extremities without difficulty  Other:  Cranial nerves II through XII grossly intact.  Intact finger-nose, intact heel-to-shin.  Romberg negative, gait normal.  Alert and oriented x3.  Moves all 4 limbs spontaneously, normal coordination.  No pronator drift.  Intact strength 5 out of 5 bilateral upper and lower extremities.   Medical Decision Making  Medically screening exam initiated at 12:30 PM.  Appropriate orders placed.  Alex Cain was informed that the remainder of the evaluation will be completed by another provider, this initial triage assessment does not replace that evaluation, and the importance of remaining in the ED until their evaluation is complete.  Workup initiated   Olene Floss, [place] 04/14/22 1231

## 2022-04-14 NOTE — Discharge Instructions (Addendum)
Today you were seen in the emergency department for your dizziness.    In the emergency department you had a neurologic evaluation that was reassuring.    At home, please stay well-hydrated.    Follow-up with your primary doctor in 2-3 days regarding your visit.  If you do not have a primary doctor please use the primary care doctor listed in this packet droppage to establish care.  Talk to them to see if you need monitoring to assess for arrhythmias.  Return immediately to the emergency department if you experience any of the following: Chest pain, shortness of breath, fainting, or any other concerning symptoms.    Thank you for visiting our Emergency Department. It was a pleasure taking care of you today.

## 2022-12-07 ENCOUNTER — Other Ambulatory Visit: Payer: Self-pay

## 2022-12-07 ENCOUNTER — Emergency Department (HOSPITAL_COMMUNITY)
Admission: EM | Admit: 2022-12-07 | Discharge: 2022-12-08 | Disposition: A | Discharge status: Home / Self Care | Payer: Self-pay | Attending: Emergency Medicine | Admitting: Emergency Medicine

## 2022-12-07 DIAGNOSIS — R112 Nausea with vomiting, unspecified: Secondary | ICD-10-CM | POA: Insufficient documentation

## 2022-12-07 DIAGNOSIS — R5383 Other fatigue: Secondary | ICD-10-CM | POA: Insufficient documentation

## 2022-12-07 DIAGNOSIS — Z20822 Contact with and (suspected) exposure to covid-19: Secondary | ICD-10-CM | POA: Insufficient documentation

## 2022-12-07 DIAGNOSIS — R944 Abnormal results of kidney function studies: Secondary | ICD-10-CM | POA: Insufficient documentation

## 2022-12-07 DIAGNOSIS — R197 Diarrhea, unspecified: Secondary | ICD-10-CM | POA: Insufficient documentation

## 2022-12-07 DIAGNOSIS — R42 Dizziness and giddiness: Secondary | ICD-10-CM | POA: Insufficient documentation

## 2022-12-07 DIAGNOSIS — E876 Hypokalemia: Secondary | ICD-10-CM | POA: Insufficient documentation

## 2022-12-07 LAB — COMPREHENSIVE METABOLIC PANEL
ALT: 13 U/L (ref 0–44)
AST: 18 U/L (ref 15–41)
Albumin: 3.8 g/dL (ref 3.5–5.0)
Alkaline Phosphatase: 70 U/L (ref 38–126)
Anion gap: 8 (ref 5–15)
BUN: 12 mg/dL (ref 6–20)
CO2: 25 mmol/L (ref 22–32)
Calcium: 9.1 mg/dL (ref 8.9–10.3)
Chloride: 103 mmol/L (ref 98–111)
Creatinine, Ser: 1.43 mg/dL — ABNORMAL HIGH (ref 0.61–1.24)
GFR, Estimated: >60 mL/min (ref >60)
Glucose, Bld: 107 mg/dL — ABNORMAL HIGH (ref 70–99)
Potassium: 3.4 mmol/L — ABNORMAL LOW (ref 3.5–5.1)
Sodium: 136 mmol/L (ref 135–145)
Total Bilirubin: 0.5 mg/dL (ref 0.3–1.2)
Total Protein: 7.1 g/dL (ref 6.5–8.1)

## 2022-12-07 LAB — RESP PANEL BY RT-PCR (RSV, FLU A&B, COVID)  RVPGX2
Influenza A by PCR: NEGATIVE
Influenza B by PCR: NEGATIVE
Resp Syncytial Virus by PCR: NEGATIVE
SARS Coronavirus 2 by RT PCR: NEGATIVE

## 2022-12-07 NOTE — ED Triage Notes (Signed)
Pt arrives with c/o n/v that started 2-3 days ago. Pt endorses dizziness, sore throat, and fatigue.

## 2022-12-08 LAB — CBC WITH DIFFERENTIAL/PLATELET
Abs Immature Granulocytes: 0.04 10*3/uL (ref 0.00–0.07)
Basophils Absolute: 0.1 10*3/uL (ref 0.0–0.1)
Basophils Relative: 1 %
Eosinophils Absolute: 0.1 10*3/uL (ref 0.0–0.5)
Eosinophils Relative: 1 %
HCT: 43.2 % (ref 39.0–52.0)
Hemoglobin: 14.2 g/dL (ref 13.0–17.0)
Immature Granulocytes: 0 %
Lymphocytes Relative: 34 %
Lymphs Abs: 3.5 10*3/uL (ref 0.7–4.0)
MCH: 31.1 pg (ref 26.0–34.0)
MCHC: 32.9 g/dL (ref 30.0–36.0)
MCV: 94.7 fL (ref 80.0–100.0)
Monocytes Absolute: 1 10*3/uL (ref 0.1–1.0)
Monocytes Relative: 10 %
Neutro Abs: 5.6 10*3/uL (ref 1.7–7.7)
Neutrophils Relative %: 54 %
Platelets: 191 10*3/uL (ref 150–400)
RBC: 4.56 MIL/uL (ref 4.22–5.81)
RDW: 13.4 % (ref 11.5–15.5)
WBC: 10.2 10*3/uL (ref 4.0–10.5)
nRBC: 0 % (ref 0.0–0.2)

## 2022-12-08 MED ORDER — ONDANSETRON HCL 4 MG/2ML IJ SOLN
4.0000 mg | Freq: Once | INTRAMUSCULAR | Status: AC
  Administered 2022-12-08: 4 mg via INTRAVENOUS
  Filled 2022-12-08: qty 2

## 2022-12-08 MED ORDER — SODIUM CHLORIDE 0.9 % IV BOLUS
1000.0000 mL | Freq: Once | INTRAVENOUS | Status: AC
  Administered 2022-12-08: 1000 mL via INTRAVENOUS

## 2022-12-08 MED ORDER — ONDANSETRON 4 MG PO TBDP: 4.0000 mg | ORAL_TABLET | Freq: Three times a day (TID) | ORAL | 0 refills | Status: AC | PRN

## 2022-12-08 NOTE — Discharge Instructions (Signed)
Avoid fried foods, fatty foods, greasy foods, and milk products until symptoms resolve. Drink plenty of clear liquids. Use of Zofran as prescribed for nausea/vomiting. Follow-up with your primary care doctor to ensure resolution of symptoms.
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
## 2022-12-08 NOTE — ED Notes (Signed)
Fluid challenge passed.  

## 2023-01-17 ENCOUNTER — Other Ambulatory Visit: Payer: Self-pay

## 2023-01-17 ENCOUNTER — Emergency Department (HOSPITAL_COMMUNITY)
Admission: EM | Admit: 2023-01-17 | Discharge: 2023-01-17 | Disposition: A | Discharge status: Home / Self Care | Payer: Self-pay | Attending: Emergency Medicine | Admitting: Emergency Medicine

## 2023-01-17 ENCOUNTER — Encounter (HOSPITAL_COMMUNITY): Payer: Self-pay | Admitting: Emergency Medicine

## 2023-01-17 DIAGNOSIS — R519 Headache, unspecified: Secondary | ICD-10-CM | POA: Insufficient documentation

## 2023-01-17 DIAGNOSIS — R42 Dizziness and giddiness: Secondary | ICD-10-CM | POA: Insufficient documentation

## 2023-01-17 LAB — CBC
HCT: 38.1 % — ABNORMAL LOW (ref 39.0–52.0)
Hemoglobin: 12.5 g/dL — ABNORMAL LOW (ref 13.0–17.0)
MCH: 30.3 pg (ref 26.0–34.0)
MCHC: 32.8 g/dL (ref 30.0–36.0)
MCV: 92.5 fL (ref 80.0–100.0)
Platelets: 209 10*3/uL (ref 150–400)
RBC: 4.12 MIL/uL — ABNORMAL LOW (ref 4.22–5.81)
RDW: 13.5 % (ref 11.5–15.5)
WBC: 9.4 10*3/uL (ref 4.0–10.5)
nRBC: 0 % (ref 0.0–0.2)

## 2023-01-17 LAB — BASIC METABOLIC PANEL
Anion gap: 11 (ref 5–15)
BUN: 14 mg/dL (ref 6–20)
CO2: 25 mmol/L (ref 22–32)
Calcium: 8.7 mg/dL — ABNORMAL LOW (ref 8.9–10.3)
Chloride: 100 mmol/L (ref 98–111)
Creatinine, Ser: 1.13 mg/dL (ref 0.61–1.24)
GFR, Estimated: >60 mL/min (ref >60)
Glucose, Bld: 78 mg/dL (ref 70–99)
Potassium: 3.6 mmol/L (ref 3.5–5.1)
Sodium: 136 mmol/L (ref 135–145)

## 2023-01-17 LAB — URINALYSIS, ROUTINE W REFLEX MICROSCOPIC
Bacteria, UA: NONE SEEN
Bilirubin Urine: NEGATIVE
Glucose, UA: NEGATIVE mg/dL
Ketones, ur: NEGATIVE mg/dL
Leukocytes,Ua: NEGATIVE
Nitrite: NEGATIVE
Protein, ur: NEGATIVE mg/dL
Specific Gravity, Urine: 1.014 (ref 1.005–1.030)
pH: 6 (ref 5.0–8.0)

## 2023-01-17 LAB — CBG MONITORING, ED
Glucose-Capillary: 62 mg/dL — ABNORMAL LOW (ref 70–99)
Glucose-Capillary: 98 mg/dL (ref 70–99)

## 2023-01-17 MED ORDER — SODIUM CHLORIDE 0.9 % IV BOLUS
1000.0000 mL | Freq: Once | INTRAVENOUS | Status: AC
  Administered 2023-01-17: 1000 mL via INTRAVENOUS

## 2023-01-17 MED ORDER — MECLIZINE HCL 25 MG PO TABS: 25.0000 mg | ORAL_TABLET | Freq: Three times a day (TID) | ORAL | 0 refills | Status: DC | PRN

## 2023-01-17 MED ORDER — KETOROLAC TROMETHAMINE 30 MG/ML IJ SOLN
15.0000 mg | Freq: Once | INTRAMUSCULAR | Status: AC
  Administered 2023-01-17: 15 mg via INTRAVENOUS
  Filled 2023-01-17: qty 1

## 2023-01-17 MED ORDER — MECLIZINE HCL 25 MG PO TABS
25.0000 mg | ORAL_TABLET | Freq: Once | ORAL | Status: AC
  Administered 2023-01-17: 25 mg via ORAL
  Filled 2023-01-17: qty 1

## 2023-01-17 NOTE — ED Provider Notes (Signed)
Emergency Department Provider Note   I have reviewed the triage vital signs and the nursing notes.   HISTORY  Chief Complaint Headache and Dizziness   HPI Alex Cain is a 37 y.o. male with MDD presents to the emergency department for evaluation of intermittent vertigo symptoms with associated near syncope.  He has had vertigo on and off for years.  No tinnitus.  Symptoms worse with looking down but also notices symptoms looking in other directions.  2 days ago, he was at work and felt suddenly lightheaded, like he was going to pass out.  He had some associated heart palpitations without chest pain but did feel some shortness of breath.  He states he was able to "snap myself out of it" but presents today with continued vertigo this morning along with headache. No CP.    Past Medical History:  Diagnosis Date   Major depressive disorder     Review of Systems  Constitutional: No fever/chills. Intermittent vertigo.  Cardiovascular: Denies chest pain. Positive palpitations and near syncope.  Respiratory: Positive shortness of breath. Gastrointestinal: No abdominal pain.  No nausea, no vomiting.  No diarrhea.  No constipation. Genitourinary: Negative for dysuria. Musculoskeletal: Negative for back pain. Skin: Negative for rash. Neurological: Negative for headaches, focal weakness or numbness.  ____________________________________________   PHYSICAL EXAM:  VITAL SIGNS: ED Triage Vitals  Enc Vitals Group     BP 01/17/23 0621 124/80     Pulse Rate 01/17/23 0621 69     Resp 01/17/23 0621 18     Temp 01/17/23 0622 98.1 F (36.7 C)     Temp Source 01/17/23 0622 Oral     SpO2 01/17/23 0621 98 %     Weight 01/17/23 0625 150 lb (68 kg)   Constitutional: Alert and oriented. Well appearing and in no acute distress. Eyes: Conjunctivae are normal. PERRL. EOMI. No nystagmus.  Head: Atraumatic. Nose: No congestion/rhinnorhea. Mouth/Throat: Mucous membranes are moist.   Neck: No stridor.   Cardiovascular: Normal rate, regular rhythm. Good peripheral circulation. Grossly normal heart sounds.   Respiratory: Normal respiratory effort.  No retractions. Lungs CTAB. Gastrointestinal: Soft and nontender. No distention.  Musculoskeletal: No lower extremity tenderness nor edema. No gross deformities of extremities. Neurologic:  Normal speech and language. No gross focal neurologic deficits are appreciated. Normal finger to nose.  Skin:  Skin is warm, dry and intact. No rash noted.  ____________________________________________   LABS (all labs ordered are listed, but only abnormal results are displayed)  Labs Reviewed  BASIC METABOLIC PANEL - Abnormal; Notable for the following components:      Result Value   Calcium 8.7 (*)    All other components within normal limits  CBC - Abnormal; Notable for the following components:   RBC 4.12 (*)    Hemoglobin 12.5 (*)    HCT 38.1 (*)    All other components within normal limits  URINALYSIS, ROUTINE W REFLEX MICROSCOPIC - Abnormal; Notable for the following components:   Hgb urine dipstick SMALL (*)    All other components within normal limits  CBG MONITORING, ED - Abnormal; Notable for the following components:   Glucose-Capillary 62 (*)    All other components within normal limits  CBG MONITORING, ED   ____________________________________________  EKG   EKG Interpretation  Date/Time:  Saturday January 17 2023 06:28:49 EDT Ventricular Rate:  71 PR Interval:  130 QRS Duration: 84 QT Interval:  372 QTC Calculation: 404 R Axis:   79 Text Interpretation:  Normal sinus rhythm with sinus arrhythmia Normal ECG When compared with ECG of 07-Dec-2022 20:13, PREVIOUS ECG IS PRESENT Confirmed by Alona Bene [phone]) on 01/17/2023 7:23:30 AM        ____________________________________________   PROCEDURES  Procedure(s) performed:   Procedures  None  ____________________________________________   INITIAL  IMPRESSION / ASSESSMENT AND PLAN / ED COURSE  Pertinent labs & imaging results that were available during my care of the patient were reviewed by me and considered in my medical decision making (see chart for details).   This patient is Presenting for Evaluation of HA, which does require a range of treatment options, and is a complaint that involves a high risk of morbidity and mortality.  The Differential Diagnoses includes but is not exclusive to subarachnoid hemorrhage, meningitis, encephalitis, previous head trauma, cavernous venous thrombosis, muscle tension headache, glaucoma, temporal arteritis, migraine or migraine equivalent, etc.   Critical Interventions-    Medications  meclizine (ANTIVERT) tablet 25 mg (25 mg Oral Given 01/17/23 0810)  sodium chloride 0.9 % bolus 1,000 mL (1,000 mLs Intravenous New Bag/Given 01/17/23 0810)  ketorolac (TORADOL) 30 MG/ML injection 15 mg (15 mg Intravenous Given 01/17/23 0810)    Reassessment after intervention: symptoms improved.   I decided to review pertinent External Data, and in summary patient's last CT head in our system was 04/2022. No acute findings.    Clinical Laboratory Tests Ordered, included no acute kidney injury or electrolyte disturbance.  Mild anemia at 12.5.  No leukocytosis.  No UTI.  Radiologic Tests: Considered CT imaging of the head versus MRI but patient without focal neurologic deficits.  Vertigo symptoms have been intermittent for months/years. Normal exam currently.   Cardiac Monitor Tracing which shows NSR.   Social Determinants of Health Risk patient is a smoker.   Medical Decision Making: Summary:  Patient presents emergency department with lightheadedness and near syncope which occurred 2 days prior.  He is continue to have some occasional vertigo which is not unusual for him.  Also complaining of mild headache.  No sudden onset, maximal intensity headache to suspect subarachnoid hemorrhage or prompt CT imaging.   Plan for symptom management and PCP follow-up.  Reevaluation with update and discussion with patient. Symptoms improved. Asking for discharge. Troponin not complete but no CP and near syncope incident was 2 days prior. Will defer further testing and d/c with Meclizine Rx and plan for PCP follow up in the coming week.   Patient's presentation is most consistent with acute presentation with potential threat to life or bodily function.   Disposition: discharge  ____________________________________________  FINAL CLINICAL IMPRESSION(S) / ED DIAGNOSES  Final diagnoses:  Vertigo  Bad headache     NEW OUTPATIENT MEDICATIONS STARTED DURING THIS VISIT:  New Prescriptions   MECLIZINE (ANTIVERT) 25 MG TABLET    Take 1 tablet (25 mg total) by mouth 3 (three) times daily as needed for dizziness.    Note:  This document was prepared using Dragon voice recognition software and may include unintentional dictation errors.  Alona Bene, MD, [facility] Emergency Medicine    Ananda Caya, Arlyss Repress, MD 01/17/23 [phone]

## 2023-01-17 NOTE — Discharge Instructions (Signed)
We believe your symptoms were caused by benign vertigo.  Please read through the included information and take any prescribed medication(s).  Follow up with your doctor as listed above. ° °If you develop any new or worsening symptoms that concern you, including but not limited to persistent dizziness/vertigo, numbness or weakness in your arms or legs, altered mental status, persistent vomiting, or fever greater than 101, please return immediately to the Emergency Department. ° °

## 2023-01-17 NOTE — ED Notes (Signed)
Juice and snack provided to pt

## 2023-01-17 NOTE — ED Notes (Signed)
Pt states he has to go to work and is going to pull off all of the cords and the IV out. EDP made aware and pt placed up for discharge. Pt seen walking out of department

## 2023-01-17 NOTE — ED Triage Notes (Signed)
Pt in with headache since last night, along with ongoing dizziness over the past few days. Pt states dizziness worse with standing. Denies any cp or sob

## 2023-01-18 ENCOUNTER — Other Ambulatory Visit: Payer: Self-pay

## 2023-01-18 ENCOUNTER — Emergency Department (HOSPITAL_COMMUNITY)
Admission: EM | Admit: 2023-01-18 | Discharge: 2023-01-18 | Disposition: A | Discharge status: Home / Self Care | Payer: BLUE CROSS/BLUE SHIELD | Attending: Student | Admitting: Student

## 2023-01-18 ENCOUNTER — Encounter (HOSPITAL_COMMUNITY): Payer: Self-pay

## 2023-01-18 DIAGNOSIS — R42 Dizziness and giddiness: Secondary | ICD-10-CM | POA: Diagnosis present

## 2023-01-18 DIAGNOSIS — F17210 Nicotine dependence, cigarettes, uncomplicated: Secondary | ICD-10-CM | POA: Insufficient documentation

## 2023-01-18 LAB — CBG MONITORING, ED: Glucose-Capillary: 105 mg/dL — ABNORMAL HIGH (ref 70–99)

## 2023-01-18 LAB — URINALYSIS, ROUTINE W REFLEX MICROSCOPIC
Bilirubin Urine: NEGATIVE
Glucose, UA: NEGATIVE mg/dL
Hgb urine dipstick: NEGATIVE
Ketones, ur: NEGATIVE mg/dL
Leukocytes,Ua: NEGATIVE
Nitrite: NEGATIVE
Protein, ur: NEGATIVE mg/dL
Specific Gravity, Urine: 1.017 (ref 1.005–1.030)
pH: 6 (ref 5.0–8.0)

## 2023-01-18 LAB — CBC
HCT: 38.3 % — ABNORMAL LOW (ref 39.0–52.0)
Hemoglobin: 12.6 g/dL — ABNORMAL LOW (ref 13.0–17.0)
MCH: 31.6 pg (ref 26.0–34.0)
MCHC: 32.9 g/dL (ref 30.0–36.0)
MCV: 96 fL (ref 80.0–100.0)
Platelets: 203 10*3/uL (ref 150–400)
RBC: 3.99 MIL/uL — ABNORMAL LOW (ref 4.22–5.81)
RDW: 13.6 % (ref 11.5–15.5)
WBC: 8.3 10*3/uL (ref 4.0–10.5)
nRBC: 0 % (ref 0.0–0.2)

## 2023-01-18 LAB — BASIC METABOLIC PANEL
Anion gap: 5 (ref 5–15)
BUN: 11 mg/dL (ref 6–20)
CO2: 27 mmol/L (ref 22–32)
Calcium: 8.6 mg/dL — ABNORMAL LOW (ref 8.9–10.3)
Chloride: 103 mmol/L (ref 98–111)
Creatinine, Ser: 1.22 mg/dL (ref 0.61–1.24)
GFR, Estimated: >60 mL/min (ref >60)
Glucose, Bld: 88 mg/dL (ref 70–99)
Potassium: 4.3 mmol/L (ref 3.5–5.1)
Sodium: 135 mmol/L (ref 135–145)

## 2023-01-18 MED ORDER — MECLIZINE HCL 25 MG PO TABS: 25.0000 mg | ORAL_TABLET | Freq: Three times a day (TID) | ORAL | 0 refills | Status: AC | PRN

## 2023-01-18 MED ORDER — MECLIZINE HCL 25 MG PO TABS
25.0000 mg | ORAL_TABLET | Freq: Once | ORAL | Status: AC
  Administered 2023-01-18: 25 mg via ORAL
  Filled 2023-01-18: qty 1

## 2023-01-18 NOTE — Discharge Instructions (Signed)
Alex Cain:  Thank you for allowing [place] to take care of you today.  We hope you begin feeling better soon.  To-Do: Please follow-up with your primary doctor. Take meclizine as needed for vertigo-like symptoms. Please return to the Emergency Department or call 911 if you experience chest pain, shortness of breath, severe pain, severe fever, altered mental status, or have any reason to think that you need emergency medical care.  Thank you again.  Hope you feel better soon.  Department of Emergency Medicine [employer]

## 2023-01-18 NOTE — ED Provider Notes (Signed)
[place] EMERGENCY DEPARTMENT AT [facility] Provider Note   HPI: Alex Cain is a 37 year old male with a past medical history as below presenting today with vertigo.  He endorses a history of vertigo.  He reports his vertigo symptoms are worse with looking down and certain sideways head movements.  No tinnitus.  No fevers chills or headaches.  He reports he was recently seen and given a prescription for meclizine but has not taken any of this medication as he has not been able to pick it up as he had to go to work.  He reports his symptoms persisted therefore he came in for further evaluation.  He denies numbness or tingling his arms.  He denies chest pain or shortness of breath.  He denies any neck pain.  He denies any facial numbness or asymmetry.  Past Medical History:  Diagnosis Date   Major depressive disorder     History reviewed. No pertinent surgical history.   Social History   Tobacco Use   Smoking status: Every Day    Packs/day: .5    Types: Cigarettes   Smokeless tobacco: Never  Substance Use Topics   Alcohol use: Yes    Comment: socially   Drug use: Yes    Types: Marijuana    Comment: frequent      Review of Systems  A complete ROS was performed with pertinent positives/negatives noted in the HPI.   Vitals:   01/18/23 1332  BP: 133/83  Pulse: (!) 102  Resp: 18  Temp: 98.3 F (36.8 C)  SpO2: 98%    Physical Exam Vitals and nursing note reviewed.  Constitutional:      General: He is not in acute distress.    Appearance: He is well-developed.  HENT:     Head: Normocephalic and atraumatic.  Eyes:     Conjunctiva/sclera: Conjunctivae normal.  Cardiovascular:     Rate and Rhythm: Normal rate and regular rhythm.     Pulses: Normal pulses.     Heart sounds: Normal heart sounds. No murmur heard.    No friction rub. No gallop.  Pulmonary:     Effort: Pulmonary effort is normal. No respiratory distress.     Breath sounds:  Normal breath sounds. No stridor. No wheezing, rhonchi or rales.  Abdominal:     General: Abdomen is flat. There is no distension.  Musculoskeletal:        General: No swelling.     Cervical back: Neck supple.  Skin:    General: Skin is warm and dry.     Capillary Refill: Capillary refill takes less than 2 seconds.  Neurological:     Mental Status: He is alert.     Sensory: Sensation is intact. No sensory deficit.     Motor: Motor function is intact. No weakness, tremor, atrophy, abnormal muscle tone, seizure activity or pronator drift.     Coordination: Coordination is intact. Coordination normal. Finger-Nose-Finger Test and Heel to Shin Test normal.     Comments: CRANIAL NERVES: 2 (Optic) - Visual fields intact. PERRL brisk.  3/4/6 (Oculomotor, Trochlear, Abducens) - EOM full with horizontal nystagmus 5 (Trigeminal) - sensation intact to light touch 7 (Facial) - No facial weakness or asymmetry.  8 (Vestibulococchlear) - responds to voice, auditory acuity grossly normal 9/10 (Glossopharyngeal) - symmetric palate and uvula elevation 11 (Spinal accessory) - head midline, Normal and symmetric sternocleidomastoid and trapezius strength  12 (Hypoglossal) - tongue midline  HINT exam: No skew deviation,  head impulse test with corrective cicada, horizontal nystagmus  Psychiatric:        Mood and Affect: Mood normal.     Procedures  MDM:  Lab results:  Results for orders placed or performed during the hospital encounter of 01/18/23 (from the past 24 hour(s))  CBG monitoring, ED     Status: Abnormal   Collection Time: 01/18/23  1:37 PM  Result Value Ref Range   Glucose-Capillary 105 (H) 70 - 99 mg/dL   Comment 1 Notify RN    Comment 2 Document in Chart   Urinalysis, Routine w reflex microscopic -Urine, Clean Catch     Status: None   Collection Time: 01/18/23  1:53 PM  Result Value Ref Range   Color, Urine YELLOW YELLOW   APPearance CLEAR CLEAR   Specific Gravity, Urine 1.017  1.005 - 1.030   pH 6.0 5.0 - 8.0   Glucose, UA NEGATIVE NEGATIVE mg/dL   Hgb urine dipstick NEGATIVE NEGATIVE   Bilirubin Urine NEGATIVE NEGATIVE   Ketones, ur NEGATIVE NEGATIVE mg/dL   Protein, ur NEGATIVE NEGATIVE mg/dL   Nitrite NEGATIVE NEGATIVE   Leukocytes,Ua NEGATIVE NEGATIVE  Basic metabolic panel     Status: Abnormal   Collection Time: 01/18/23  2:09 PM  Result Value Ref Range   Sodium 135 135 - 145 mmol/L   Potassium 4.3 3.5 - 5.1 mmol/L   Chloride 103 98 - 111 mmol/L   CO2 27 22 - 32 mmol/L   Glucose, Bld 88 70 - 99 mg/dL   BUN 11 6 - 20 mg/dL   Creatinine, Ser 4.09 0.61 - 1.24 mg/dL   Calcium 8.6 (L) 8.9 - 10.3 mg/dL   GFR, Estimated >81 >19 mL/min   Anion gap 5 5 - 15  CBC     Status: Abnormal   Collection Time: 01/18/23  2:09 PM  Result Value Ref Range   WBC 8.3 4.0 - 10.5 K/uL   RBC 3.99 (L) 4.22 - 5.81 MIL/uL   Hemoglobin 12.6 (L) 13.0 - 17.0 g/dL   HCT 14.7 (L) 82.9 - 56.2 %   MCV 96.0 80.0 - 100.0 fL   MCH 31.6 26.0 - 34.0 pg   MCHC 32.9 30.0 - 36.0 g/dL   RDW 13.0 86.5 - 78.4 %   Platelets 203 150 - 400 K/uL   nRBC 0.0 0.0 - 0.2 %     Key medications administered in the ER:  Medications  meclizine (ANTIVERT) tablet 25 mg (25 mg Oral Given 01/18/23 1542)   Medical decision making: -Vital signs stable. Patient afebrile, hemodynamically stable, and non-toxic appearing. -Patient's presentation is most consistent with acute complicated illness / injury requiring diagnostic workup.. -Alex Cain is a 37 y.o. male presenting to the emergency department with vertigo. Both central and peripheral causes of vertigo considered. Exam most consistent with peripheral cause given hints exam with corrective CircAid, horizontal nystagmus without rotary nystagmus or vertical nystagmus, and lack of skew deviation.  Do not believe further imaging is indicated at this time.  No rashes consistent with Ramsay Hunt syndrome.  No hearing loss to suggest  labyrinthitis.  No recent viral symptoms to suggest vestibular neuritis.  No hearing loss or tinnitus to suggest Mnire's disease.  Labs collected with CBC without acute hematologic abnormality.  BMP without acute unreality.  UA unremarkable.  No hypoglycemia. -Patient treated with meclizine. ECG on my interpretation is normal sinus rhythm and rate, without anatomical ischemia representing STEMI, New-onset Arrhythmia, or ischemic equivalent.  -Upon reevaluation  patient reports resolution of his vertigo. -Patient is stable for discharge home.  Strict return precautions discussed.  Patient follow-up with PCP.  Patient discharged.    Medical Decision Making Amount and/or Complexity of Data Reviewed Labs: ordered. Decision-making details documented in ED Course. ECG/medicine tests: ordered and independent interpretation performed. Decision-making details documented in ED Course.  Risk Prescription drug management.     The plan for this patient was discussed with Dr. Posey Rea, who voiced agreement and who oversaw evaluation and treatment of this patient.  Marta Lamas, MD Emergency Medicine, PGY-3  Note: Dragon medical dictation software was used in the creation of this note.   Clinical Impression:  1. Vertigo          Chase Caller, MD 01/18/23 1715    Kommor, [place], MD 01/19/23 [phone]

## 2023-01-18 NOTE — ED Triage Notes (Signed)
Pt came in via POV d/t dizziness for the past week. No Hx of diabetes, CBG 105 in triage. Feels like his fingers are "tingly" he has also felt very tired & drained. A/Ox4, denies any pain while in triage.

## 2023-09-15 IMAGING — DX DG CERVICAL SPINE COMPLETE 4+V
5 series · 5 of 5 positions shown · non-contrast
Comparison: 03/14/2014

CLINICAL DATA: Neck pain for 2 days. Left-sided radicular symptoms.

EXAM:
CERVICAL SPINE - COMPLETE 4+ VIEW

[c-spine lat]
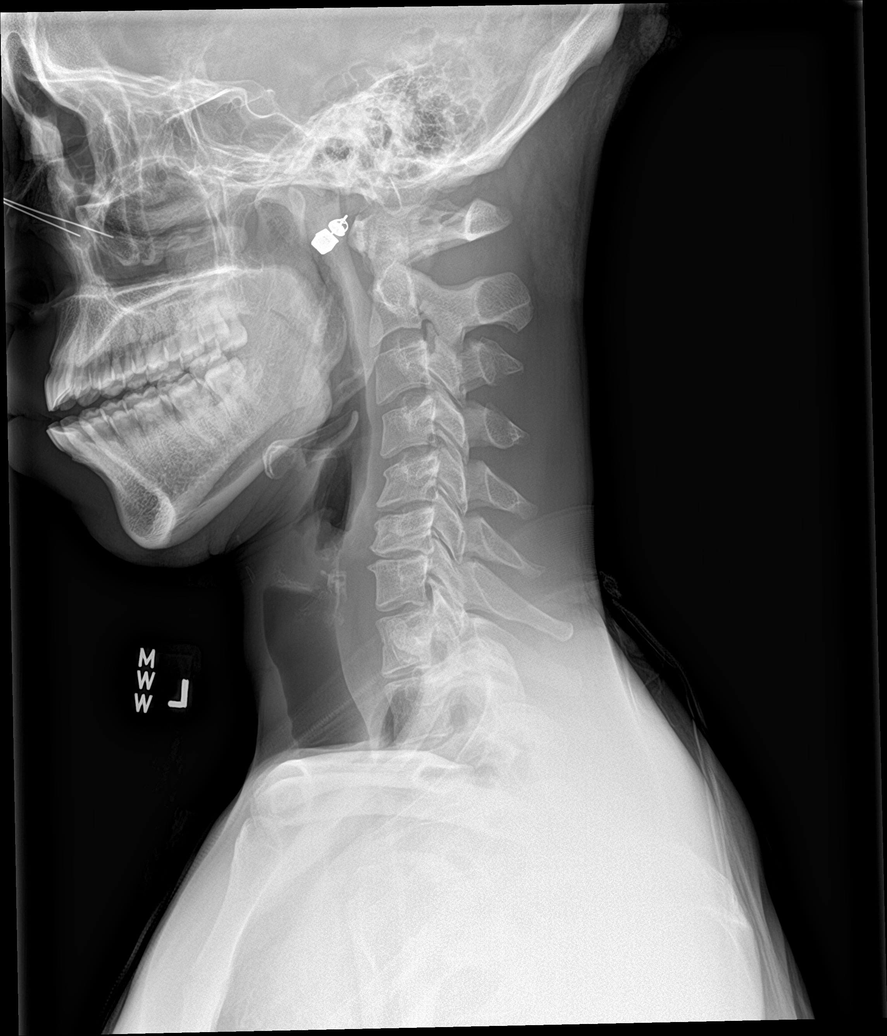

[c-spine obl (1 of 3)]
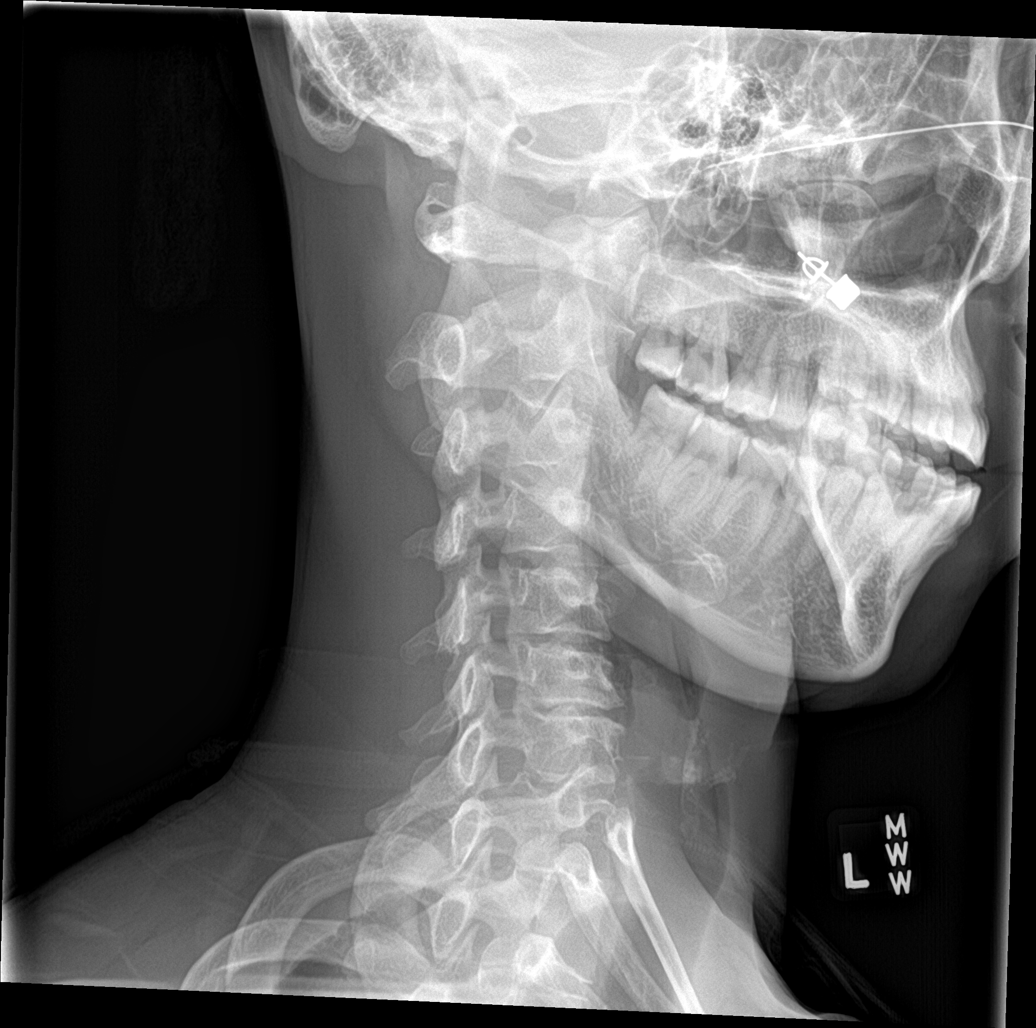

[c-spine obl (2 of 3)]
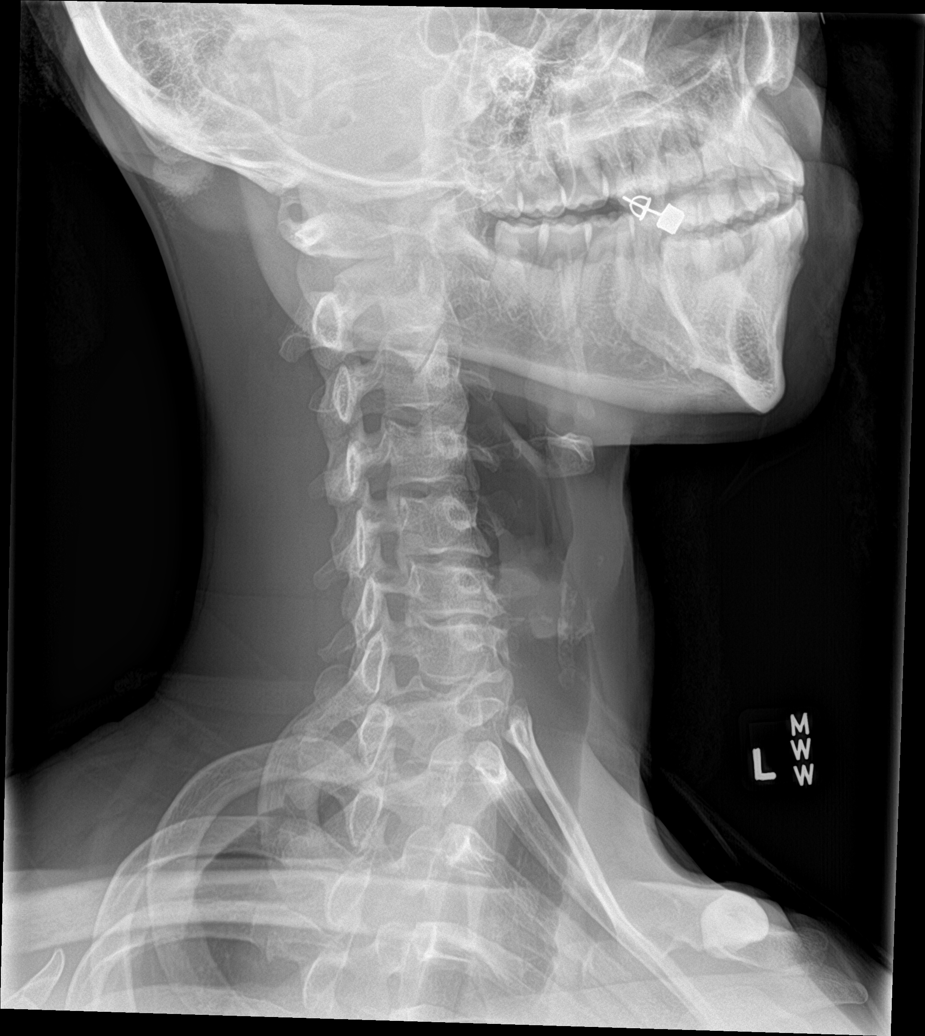

[c-spine ap]
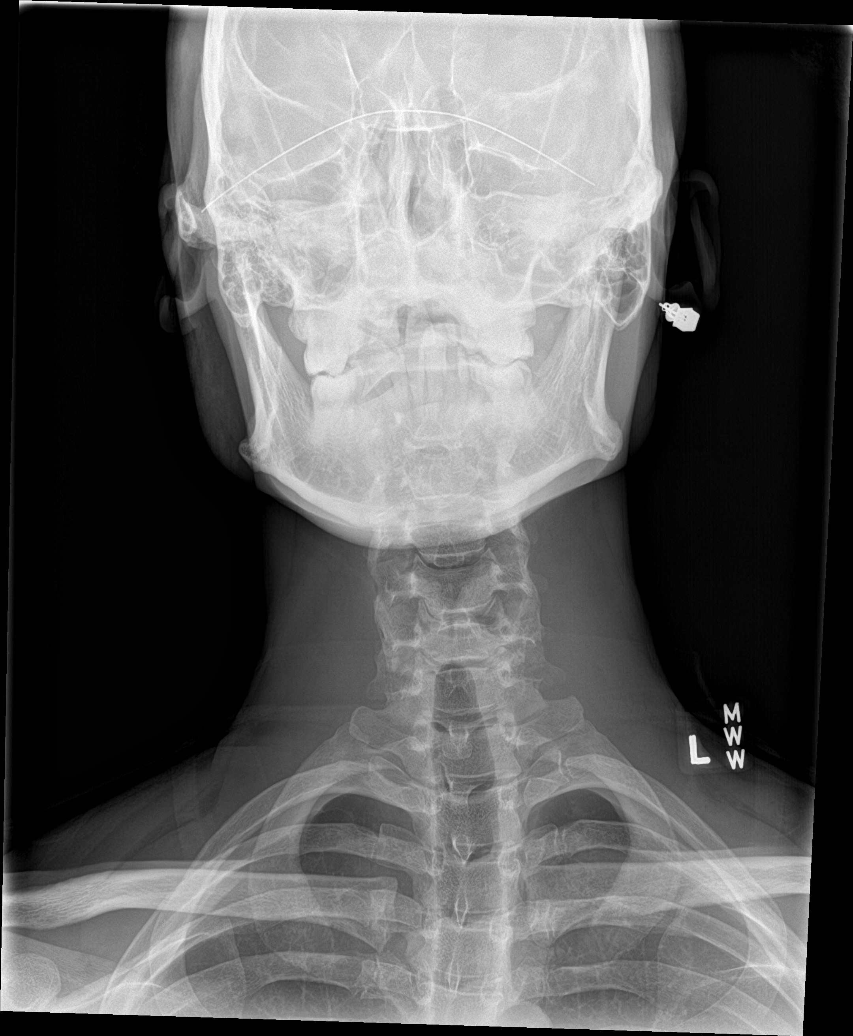

[c-spine obl (3 of 3)]
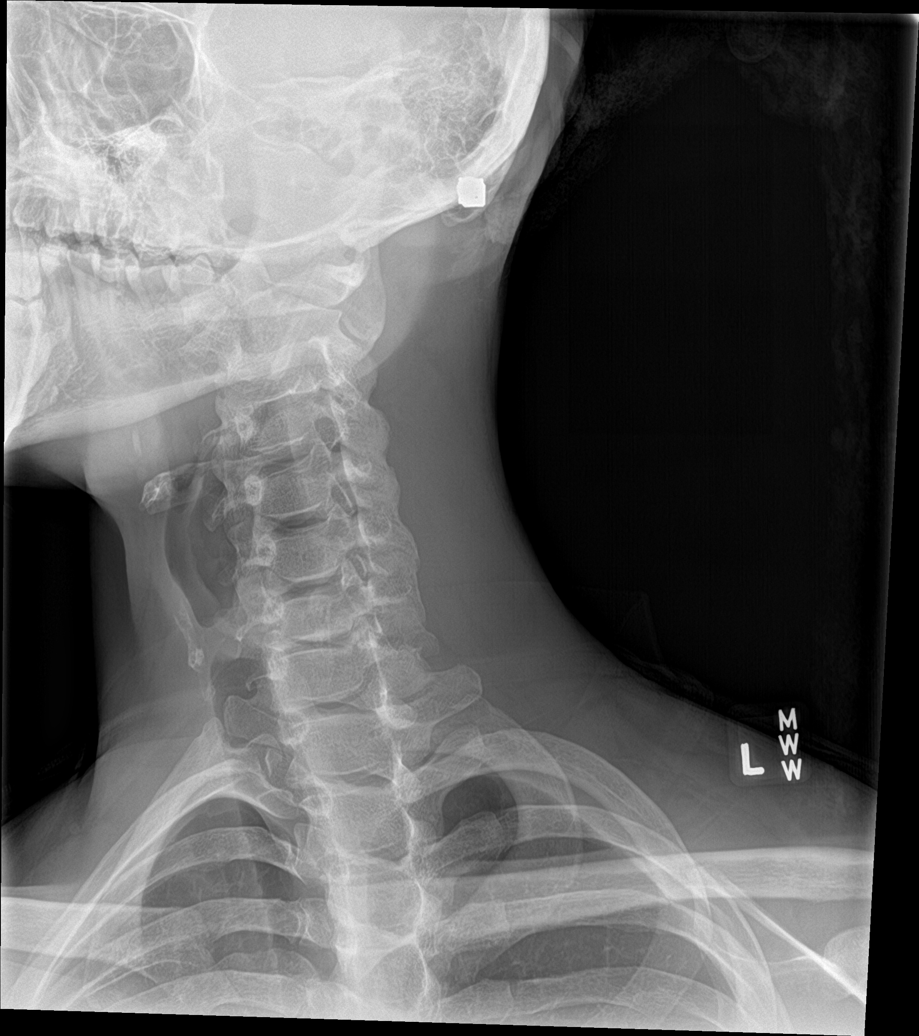

[5 of 5 positions shown; findings below may reference images not displayed]

FINDINGS: The lateral view images through the bottom of T2. Mild straightening
and reversal of expected cervical lordosis about the C4-7 level.

Maintenance of vertebral body height. Loss of intervertebral disc
height at C5-6 and C6-7.

Facets are well-aligned. Left-sided neural foramina are not well
evaluated. There may be mild C5-6 right neural foraminal narrowing
secondary to uncovertebral joint hypertrophy.
IMPRESSION: Mild, age advanced lower cervical spondylosis with possible right
neural foraminal narrowing. Left neural foramina not well evaluated
on oblique images.

## 2023-09-20 ENCOUNTER — Other Ambulatory Visit: Payer: Self-pay

## 2023-09-20 ENCOUNTER — Emergency Department (HOSPITAL_COMMUNITY): Payer: BLUE CROSS/BLUE SHIELD

## 2023-09-20 ENCOUNTER — Inpatient Hospital Stay (HOSPITAL_COMMUNITY): Payer: BLUE CROSS/BLUE SHIELD

## 2023-09-20 ENCOUNTER — Encounter (HOSPITAL_COMMUNITY): Admission: EM | Disposition: A | Discharge status: Home / Self Care | Payer: Self-pay | Source: Home / Self Care

## 2023-09-20 ENCOUNTER — Emergency Department (HOSPITAL_COMMUNITY): Payer: BLUE CROSS/BLUE SHIELD | Admitting: Certified Registered Nurse Anesthetist

## 2023-09-20 ENCOUNTER — Inpatient Hospital Stay (HOSPITAL_COMMUNITY)
Admission: EM | Admit: 2023-09-20 | Discharge: 2023-09-26 | DRG: 956 | Disposition: A | Discharge status: Home / Self Care | Payer: BLUE CROSS/BLUE SHIELD | Attending: General Surgery | Admitting: General Surgery

## 2023-09-20 ENCOUNTER — Encounter (HOSPITAL_COMMUNITY): Payer: Self-pay

## 2023-09-20 DIAGNOSIS — W3400XA Accidental discharge from unspecified firearms or gun, initial encounter: Principal | ICD-10-CM

## 2023-09-20 DIAGNOSIS — Z23 Encounter for immunization: Secondary | ICD-10-CM

## 2023-09-20 DIAGNOSIS — Z888 Allergy status to other drugs, medicaments and biological substances status: Secondary | ICD-10-CM | POA: Diagnosis not present

## 2023-09-20 DIAGNOSIS — S72452A Displaced supracondylar fracture without intracondylar extension of lower end of left femur, initial encounter for closed fracture: Secondary | ICD-10-CM | POA: Diagnosis present

## 2023-09-20 DIAGNOSIS — K683 Retroperitoneal hematoma: Secondary | ICD-10-CM | POA: Diagnosis present

## 2023-09-20 DIAGNOSIS — R310 Gross hematuria: Secondary | ICD-10-CM | POA: Diagnosis present

## 2023-09-20 DIAGNOSIS — F419 Anxiety disorder, unspecified: Secondary | ICD-10-CM | POA: Diagnosis present

## 2023-09-20 DIAGNOSIS — F4310 Post-traumatic stress disorder, unspecified: Secondary | ICD-10-CM | POA: Diagnosis present

## 2023-09-20 DIAGNOSIS — F22 Delusional disorders: Secondary | ICD-10-CM | POA: Diagnosis present

## 2023-09-20 DIAGNOSIS — Z635 Disruption of family by separation and divorce: Secondary | ICD-10-CM

## 2023-09-20 DIAGNOSIS — S71131A Puncture wound without foreign body, right thigh, initial encounter: Secondary | ICD-10-CM | POA: Diagnosis present

## 2023-09-20 DIAGNOSIS — F329 Major depressive disorder, single episode, unspecified: Secondary | ICD-10-CM | POA: Diagnosis present

## 2023-09-20 DIAGNOSIS — K661 Hemoperitoneum: Secondary | ICD-10-CM | POA: Diagnosis present

## 2023-09-20 DIAGNOSIS — S37051A Moderate laceration of right kidney, initial encounter: Secondary | ICD-10-CM | POA: Diagnosis present

## 2023-09-20 DIAGNOSIS — D62 Acute posthemorrhagic anemia: Secondary | ICD-10-CM | POA: Diagnosis not present

## 2023-09-20 DIAGNOSIS — F430 Acute stress reaction: Secondary | ICD-10-CM | POA: Diagnosis present

## 2023-09-20 DIAGNOSIS — S31630A Puncture wound without foreign body of abdominal wall, right upper quadrant with penetration into peritoneal cavity, initial encounter: Secondary | ICD-10-CM | POA: Diagnosis present

## 2023-09-20 DIAGNOSIS — S36116A Major laceration of liver, initial encounter: Principal | ICD-10-CM | POA: Diagnosis present

## 2023-09-20 DIAGNOSIS — S31133A Puncture wound of abdominal wall without foreign body, right lower quadrant without penetration into peritoneal cavity, initial encounter: Secondary | ICD-10-CM | POA: Diagnosis present

## 2023-09-20 DIAGNOSIS — R066 Hiccough: Secondary | ICD-10-CM | POA: Diagnosis not present

## 2023-09-20 DIAGNOSIS — K9189 Other postprocedural complications and disorders of digestive system: Secondary | ICD-10-CM | POA: Diagnosis not present

## 2023-09-20 DIAGNOSIS — Z5901 Sheltered homelessness: Secondary | ICD-10-CM

## 2023-09-20 DIAGNOSIS — G4700 Insomnia, unspecified: Secondary | ICD-10-CM | POA: Diagnosis present

## 2023-09-20 DIAGNOSIS — K567 Ileus, unspecified: Secondary | ICD-10-CM | POA: Diagnosis not present

## 2023-09-20 DIAGNOSIS — S72302B Unspecified fracture of shaft of left femur, initial encounter for open fracture type I or II: Secondary | ICD-10-CM | POA: Diagnosis present

## 2023-09-20 HISTORY — PX: EXTERNAL FIXATION LEG: SHX1549

## 2023-09-20 HISTORY — PX: LAPAROTOMY: SHX154

## 2023-09-20 LAB — I-STAT CG4 LACTIC ACID, ED: Lactic Acid, Venous: 4.3 mmol/L (ref 0.5–1.9)

## 2023-09-20 LAB — POCT I-STAT 7, (LYTES, BLD GAS, ICA,H+H)
Acid-base deficit: 4 mmol/L — ABNORMAL HIGH (ref 0.0–2.0)
Acid-base deficit: 3 mmol/L — ABNORMAL HIGH (ref 0.0–2.0)
Bicarbonate: 21.6 mmol/L (ref 20.0–28.0)
Bicarbonate: 21.6 mmol/L (ref 20.0–28.0)
Calcium, Ion: 1.1 mmol/L — ABNORMAL LOW (ref 1.15–1.40)
Calcium, Ion: 0.87 mmol/L — CL (ref 1.15–1.40)
HCT: 26 % — ABNORMAL LOW (ref 39.0–52.0)
HCT: 29 % — ABNORMAL LOW (ref 39.0–52.0)
Hemoglobin: 8.8 g/dL — ABNORMAL LOW (ref 13.0–17.0)
Hemoglobin: 9.9 g/dL — ABNORMAL LOW (ref 13.0–17.0)
O2 Saturation: 100 %
O2 Saturation: 100 %
Patient temperature: 33.6
Patient temperature: 33.4
Potassium: 3.5 mmol/L (ref 3.5–5.1)
Potassium: 4.2 mmol/L (ref 3.5–5.1)
Sodium: 135 mmol/L (ref 135–145)
Sodium: 137 mmol/L (ref 135–145)
TCO2: 23 mmol/L (ref 22–32)
TCO2: 23 mmol/L (ref 22–32)
pCO2 arterial: 34.3 mmHg (ref 32–48)
pCO2 arterial: 30.8 mmHg — ABNORMAL LOW (ref 32–48)
pH, Arterial: 7.392 (ref 7.35–7.45)
pH, Arterial: 7.438 (ref 7.35–7.45)
pO2, Arterial: 287 mmHg — ABNORMAL HIGH (ref 83–108)
pO2, Arterial: 258 mmHg — ABNORMAL HIGH (ref 83–108)

## 2023-09-20 LAB — CBC
HCT: 41.1 % (ref 39.0–52.0)
Hemoglobin: 13.3 g/dL (ref 13.0–17.0)
MCH: 31.1 pg (ref 26.0–34.0)
MCHC: 32.4 g/dL (ref 30.0–36.0)
MCV: 96.3 fL (ref 80.0–100.0)
Platelets: 213 10*3/uL (ref 150–400)
RBC: 4.27 MIL/uL (ref 4.22–5.81)
RDW: 13.4 % (ref 11.5–15.5)
WBC: 8.7 10*3/uL (ref 4.0–10.5)
nRBC: 0 % (ref 0.0–0.2)

## 2023-09-20 LAB — URINALYSIS, ROUTINE W REFLEX MICROSCOPIC
Bacteria, UA: NONE SEEN
Bilirubin Urine: NEGATIVE
Glucose, UA: NEGATIVE mg/dL
Ketones, ur: NEGATIVE mg/dL
Leukocytes,Ua: NEGATIVE
Nitrite: NEGATIVE
Protein, ur: 100 mg/dL — AB
RBC / HPF: >50 RBC/hpf (ref 0–5)
Specific Gravity, Urine: 1.036 — ABNORMAL HIGH (ref 1.005–1.030)
WBC, UA: >50 WBC/hpf (ref 0–5)
pH: 5 (ref 5.0–8.0)

## 2023-09-20 LAB — COMPREHENSIVE METABOLIC PANEL
ALT: 112 U/L — ABNORMAL HIGH (ref 0–44)
AST: 120 U/L — ABNORMAL HIGH (ref 15–41)
Albumin: 3.2 g/dL — ABNORMAL LOW (ref 3.5–5.0)
Alkaline Phosphatase: 52 U/L (ref 38–126)
Anion gap: 13 (ref 5–15)
BUN: 9 mg/dL (ref 6–20)
CO2: 23 mmol/L (ref 22–32)
Calcium: 8.5 mg/dL — ABNORMAL LOW (ref 8.9–10.3)
Chloride: 101 mmol/L (ref 98–111)
Creatinine, Ser: 1.38 mg/dL — ABNORMAL HIGH (ref 0.61–1.24)
GFR, Estimated: >60 mL/min (ref >60)
Glucose, Bld: 167 mg/dL — ABNORMAL HIGH (ref 70–99)
Potassium: 3.1 mmol/L — ABNORMAL LOW (ref 3.5–5.1)
Sodium: 137 mmol/L (ref 135–145)
Total Bilirubin: 0.6 mg/dL (ref 0.0–1.2)
Total Protein: 6 g/dL — ABNORMAL LOW (ref 6.5–8.1)

## 2023-09-20 LAB — HIV ANTIBODY (ROUTINE TESTING W REFLEX): HIV Screen 4th Generation wRfx: NONREACTIVE

## 2023-09-20 LAB — I-STAT CHEM 8, ED
BUN: 11 mg/dL (ref 6–20)
Calcium, Ion: 1.1 mmol/L — ABNORMAL LOW (ref 1.15–1.40)
Chloride: 100 mmol/L (ref 98–111)
Creatinine, Ser: 1.4 mg/dL — ABNORMAL HIGH (ref 0.61–1.24)
Glucose, Bld: 157 mg/dL — ABNORMAL HIGH (ref 70–99)
HCT: 41 % (ref 39.0–52.0)
Hemoglobin: 13.9 g/dL (ref 13.0–17.0)
Potassium: 3 mmol/L — ABNORMAL LOW (ref 3.5–5.1)
Sodium: 137 mmol/L (ref 135–145)
TCO2: 24 mmol/L (ref 22–32)

## 2023-09-20 LAB — ABO/RH: ABO/RH(D): O POS

## 2023-09-20 LAB — ETHANOL: Alcohol, Ethyl (B): <10 mg/dL (ref <10)

## 2023-09-20 LAB — PROTIME-INR
INR: 1.1 (ref 0.8–1.2)
Prothrombin Time: 14.2 s (ref 11.4–15.2)

## 2023-09-20 LAB — SAMPLE TO BLOOD BANK

## 2023-09-20 LAB — PREPARE RBC (CROSSMATCH)

## 2023-09-20 SURGERY — LAPAROTOMY, EXPLORATORY
Anesthesia: General | Site: Leg Lower

## 2023-09-20 MED ORDER — NALOXONE HCL 0.4 MG/ML IJ SOLN: 0.4000 mg | INTRAMUSCULAR | Status: DC | PRN

## 2023-09-20 MED ORDER — ONDANSETRON 4 MG PO TBDP: 4.0000 mg | ORAL_TABLET | Freq: Four times a day (QID) | ORAL | Status: DC | PRN

## 2023-09-20 MED ORDER — ALBUMIN HUMAN 5 % IV SOLN: INTRAVENOUS | Status: DC | PRN

## 2023-09-20 MED ORDER — ONDANSETRON HCL 4 MG/2ML IJ SOLN
INTRAMUSCULAR | Status: DC | PRN
  Administered 2023-09-20: 4 mg via INTRAVENOUS

## 2023-09-20 MED ORDER — SUGAMMADEX SODIUM 200 MG/2ML IV SOLN
INTRAVENOUS | Status: DC | PRN
  Administered 2023-09-20: 200 mg via INTRAVENOUS

## 2023-09-20 MED ORDER — LIDOCAINE 2% (20 MG/ML) 5 ML SYRINGE
INTRAMUSCULAR | Status: DC | PRN
  Administered 2023-09-20: 60 mg via INTRAVENOUS

## 2023-09-20 MED ORDER — METHOCARBAMOL 1000 MG/10ML IJ SOLN
500.0000 mg | Freq: Three times a day (TID) | INTRAMUSCULAR | Status: DC
  Administered 2023-09-20 – 2023-09-21 (×2): 500 mg via INTRAVENOUS
  Filled 2023-09-20 (×2): qty 10

## 2023-09-20 MED ORDER — MIDAZOLAM HCL 2 MG/2ML IJ SOLN
INTRAMUSCULAR | Status: DC | PRN
  Administered 2023-09-20: 2 mg via INTRAVENOUS

## 2023-09-20 MED ORDER — FENTANYL CITRATE PF 50 MCG/ML IJ SOSY
PREFILLED_SYRINGE | INTRAMUSCULAR | Status: AC
  Filled 2023-09-20: qty 1

## 2023-09-20 MED ORDER — HYDRALAZINE HCL 20 MG/ML IJ SOLN: 10.0000 mg | INTRAMUSCULAR | Status: DC | PRN

## 2023-09-20 MED ORDER — MIDAZOLAM HCL 2 MG/2ML IJ SOLN
INTRAMUSCULAR | Status: AC
  Filled 2023-09-20: qty 2

## 2023-09-20 MED ORDER — IOHEXOL 350 MG/ML SOLN
100.0000 mL | Freq: Once | INTRAVENOUS | Status: AC | PRN
  Administered 2023-09-20: 100 mL via INTRAVENOUS

## 2023-09-20 MED ORDER — DIPHENHYDRAMINE HCL 12.5 MG/5ML PO ELIX: 12.5000 mg | ORAL_SOLUTION | Freq: Four times a day (QID) | ORAL | Status: DC | PRN

## 2023-09-20 MED ORDER — HYDROMORPHONE 1 MG/ML IV SOLN
INTRAVENOUS | Status: DC
  Administered 2023-09-20: 2.6 mg via INTRAVENOUS
  Administered 2023-09-20 – 2023-09-21 (×2): 30 mg via INTRAVENOUS
  Administered 2023-09-21: 1.2 mg via INTRAVENOUS
  Administered 2023-09-21: 0.9 mg via INTRAVENOUS
  Administered 2023-09-21: 2.92 mg via INTRAVENOUS
  Administered 2023-09-22: 1.5 mg via INTRAVENOUS
  Administered 2023-09-22: 2.1 mg via INTRAVENOUS
  Administered 2023-09-22: 4.2 mg via INTRAVENOUS
  Administered 2023-09-23: 0.3 mg via INTRAVENOUS
  Administered 2023-09-23: 3.3 mg via INTRAVENOUS
  Filled 2023-09-20: qty 30

## 2023-09-20 MED ORDER — SUCCINYLCHOLINE CHLORIDE 200 MG/10ML IV SOSY
PREFILLED_SYRINGE | INTRAVENOUS | Status: DC | PRN
  Administered 2023-09-20: 100 mg via INTRAVENOUS

## 2023-09-20 MED ORDER — KCL IN DEXTROSE-NACL 40-5-0.9 MEQ/L-%-% IV SOLN
INTRAVENOUS | Status: AC
  Filled 2023-09-20: qty 1000

## 2023-09-20 MED ORDER — FENTANYL CITRATE (PF) 250 MCG/5ML IJ SOLN
INTRAMUSCULAR | Status: DC | PRN
  Administered 2023-09-20: 100 ug via INTRAVENOUS

## 2023-09-20 MED ORDER — SODIUM CHLORIDE 0.9% FLUSH: 9.0000 mL | INTRAVENOUS | Status: DC | PRN

## 2023-09-20 MED ORDER — METHOCARBAMOL 500 MG PO TABS
500.0000 mg | ORAL_TABLET | Freq: Three times a day (TID) | ORAL | Status: DC
  Filled 2023-09-20: qty 1

## 2023-09-20 MED ORDER — FENTANYL CITRATE (PF) 250 MCG/5ML IJ SOLN
INTRAMUSCULAR | Status: AC
  Filled 2023-09-20: qty 5

## 2023-09-20 MED ORDER — ENOXAPARIN SODIUM 30 MG/0.3ML IJ SOSY
30.0000 mg | PREFILLED_SYRINGE | Freq: Two times a day (BID) | INTRAMUSCULAR | Status: DC
  Administered 2023-09-22 – 2023-09-26 (×9): 30 mg via SUBCUTANEOUS
  Filled 2023-09-20 (×8): qty 0.3

## 2023-09-20 MED ORDER — ACETAMINOPHEN 10 MG/ML IV SOLN
INTRAVENOUS | Status: AC
  Filled 2023-09-20: qty 100

## 2023-09-20 MED ORDER — SODIUM CHLORIDE 0.9 % IV BOLUS: 1000.0000 mL | Freq: Once | INTRAVENOUS | Status: DC

## 2023-09-20 MED ORDER — FENTANYL CITRATE PF 50 MCG/ML IJ SOSY
25.0000 ug | PREFILLED_SYRINGE | Freq: Once | INTRAMUSCULAR | Status: AC
  Administered 2023-09-20: 25 ug via INTRAVENOUS

## 2023-09-20 MED ORDER — 0.9 % SODIUM CHLORIDE (POUR BTL) OPTIME
TOPICAL | Status: DC | PRN
  Administered 2023-09-20: 2000 mL

## 2023-09-20 MED ORDER — PHENYLEPHRINE HCL-NACL 20-0.9 MG/250ML-% IV SOLN
INTRAVENOUS | Status: DC | PRN
  Administered 2023-09-20: 25 ug/min via INTRAVENOUS

## 2023-09-20 MED ORDER — PROPOFOL 10 MG/ML IV BOLUS
INTRAVENOUS | Status: DC | PRN
  Administered 2023-09-20: 100 mg via INTRAVENOUS

## 2023-09-20 MED ORDER — LACTATED RINGERS IV SOLN: INTRAVENOUS | Status: DC | PRN

## 2023-09-20 MED ORDER — HYDROMORPHONE 1 MG/ML IV SOLN
INTRAVENOUS | Status: AC
  Filled 2023-09-20: qty 30

## 2023-09-20 MED ORDER — SODIUM CHLORIDE 0.9% IV SOLUTION: Freq: Once | INTRAVENOUS | Status: DC

## 2023-09-20 MED ORDER — DOCUSATE SODIUM 100 MG PO CAPS
100.0000 mg | ORAL_CAPSULE | Freq: Two times a day (BID) | ORAL | Status: DC
  Administered 2023-09-21 – 2023-09-26 (×10): 100 mg via ORAL
  Filled 2023-09-20 (×11): qty 1

## 2023-09-20 MED ORDER — SODIUM CHLORIDE 0.9 % IV SOLN: INTRAVENOUS | Status: DC | PRN

## 2023-09-20 MED ORDER — ROCURONIUM BROMIDE 10 MG/ML (PF) SYRINGE
PREFILLED_SYRINGE | INTRAVENOUS | Status: DC | PRN
  Administered 2023-09-20: 50 mg via INTRAVENOUS

## 2023-09-20 MED ORDER — PIPERACILLIN-TAZOBACTAM 3.375 G IVPB 30 MIN
3.3750 g | Freq: Once | INTRAVENOUS | Status: AC
  Administered 2023-09-20: 3.375 g via INTRAVENOUS

## 2023-09-20 MED ORDER — CALCIUM CHLORIDE 10 % IV SOLN
INTRAVENOUS | Status: AC
  Filled 2023-09-20: qty 10

## 2023-09-20 MED ORDER — HYDROMORPHONE HCL 1 MG/ML IJ SOLN
0.2500 mg | INTRAMUSCULAR | Status: DC | PRN
  Administered 2023-09-20 (×4): 0.5 mg via INTRAVENOUS

## 2023-09-20 MED ORDER — POLYETHYLENE GLYCOL 3350 17 G PO PACK
17.0000 g | PACK | Freq: Every day | ORAL | Status: DC | PRN
  Administered 2023-09-23 – 2023-09-24 (×2): 17 g via ORAL
  Filled 2023-09-20 (×2): qty 1

## 2023-09-20 MED ORDER — HYDROMORPHONE HCL 1 MG/ML IJ SOLN
INTRAMUSCULAR | Status: AC
  Filled 2023-09-20: qty 1

## 2023-09-20 MED ORDER — CALCIUM CHLORIDE 10 % IV SOLN
INTRAVENOUS | Status: DC | PRN
  Administered 2023-09-20: 500 mg via INTRAVENOUS

## 2023-09-20 MED ORDER — METOPROLOL TARTRATE 5 MG/5ML IV SOLN: 5.0000 mg | Freq: Four times a day (QID) | INTRAVENOUS | Status: DC | PRN

## 2023-09-20 MED ORDER — PHENYLEPHRINE 80 MCG/ML (10ML) SYRINGE FOR IV PUSH (FOR BLOOD PRESSURE SUPPORT)
PREFILLED_SYRINGE | INTRAVENOUS | Status: DC | PRN
  Administered 2023-09-20: 160 ug via INTRAVENOUS
  Administered 2023-09-20: 80 ug via INTRAVENOUS

## 2023-09-20 MED ORDER — DIPHENHYDRAMINE HCL 50 MG/ML IJ SOLN: 12.5000 mg | Freq: Four times a day (QID) | INTRAMUSCULAR | Status: DC | PRN

## 2023-09-20 MED ORDER — TETANUS-DIPHTH-ACELL PERTUSSIS 5-2.5-18.5 LF-MCG/0.5 IM SUSY
0.5000 mL | PREFILLED_SYRINGE | Freq: Once | INTRAMUSCULAR | Status: AC
  Administered 2023-09-20: 0.5 mL via INTRAMUSCULAR

## 2023-09-20 MED ORDER — DEXAMETHASONE SODIUM PHOSPHATE 10 MG/ML IJ SOLN
INTRAMUSCULAR | Status: DC | PRN
  Administered 2023-09-20: 10 mg via INTRAVENOUS

## 2023-09-20 MED ORDER — HYDROMORPHONE HCL 1 MG/ML IJ SOLN
INTRAMUSCULAR | Status: AC
Start: 2023-09-20 — End: 2023-09-21
  Filled 2023-09-20: qty 1

## 2023-09-20 MED ORDER — ONDANSETRON HCL 4 MG/2ML IJ SOLN
4.0000 mg | Freq: Four times a day (QID) | INTRAMUSCULAR | Status: DC | PRN
Start: 2023-09-20 — End: 2023-09-26
  Administered 2023-09-20 – 2023-09-21 (×2): 4 mg via INTRAVENOUS
  Filled 2023-09-20 (×3): qty 2

## 2023-09-20 SURGICAL SUPPLY — 51 items
BAR GLASS FIBER EXFX 11X500 (EXFIX) IMPLANT
BLADE CLIPPER SURG (BLADE) IMPLANT
COVER SURGICAL LIGHT HANDLE (MISCELLANEOUS) ×3 IMPLANT
DRAPE C-ARM 42X72 X-RAY (DRAPES) IMPLANT
DRAPE C-ARMOR (DRAPES) IMPLANT
DRAPE LAPAROSCOPIC ABDOMINAL (DRAPES) ×3 IMPLANT
DRAPE SURG ORHT 6 SPLT 77X108 (DRAPES) IMPLANT
DRAPE WARM FLUID 44X44 (DRAPES) ×3 IMPLANT
DRESSING MEPILEX FLEX 4X4 (GAUZE/BANDAGES/DRESSINGS) IMPLANT
DRSG MEPILEX FLEX 4X4 (GAUZE/BANDAGES/DRESSINGS) ×2 IMPLANT
DRSG OPSITE POSTOP 4X10 (GAUZE/BANDAGES/DRESSINGS) IMPLANT
DRSG TEGADERM 4X10 (GAUZE/BANDAGES/DRESSINGS) IMPLANT
ELECT BLADE 6.5 EXT (BLADE) IMPLANT
ELECT CAUTERY BLADE 6.4 (BLADE) ×3 IMPLANT
ELECT REM PT RETURN 9FT ADLT (ELECTROSURGICAL) ×2 IMPLANT
ELECTRODE REM PT RTRN 9FT ADLT (ELECTROSURGICAL) ×3 IMPLANT
GAUZE SPONGE 4X4 12PLY STRL LF (GAUZE/BANDAGES/DRESSINGS) IMPLANT
GLOVE BIO SURGEON STRL SZ7.5 (GLOVE) ×6 IMPLANT
GLOVE BIOGEL PI IND STRL 7.5 (GLOVE) IMPLANT
GLOVE BIOGEL PI IND STRL 8 (GLOVE) ×3 IMPLANT
GLOVE BIOGEL PI MICRO STRL 6 (GLOVE) IMPLANT
GLOVE ECLIPSE 6.0 STRL STRAW (GLOVE) IMPLANT
GLOVE INDICATOR 6.5 STRL GRN (GLOVE) IMPLANT
GOWN STRL REUS W/ TWL LRG LVL3 (GOWN DISPOSABLE) ×3 IMPLANT
GOWN STRL REUS W/ TWL XL LVL3 (GOWN DISPOSABLE) ×3 IMPLANT
HANDLE SUCTION POOLE (INSTRUMENTS) ×3 IMPLANT
KIT BASIN OR (CUSTOM PROCEDURE TRAY) ×3 IMPLANT
KIT TURNOVER KIT B (KITS) ×3 IMPLANT
NS IRRIG 1000ML POUR BTL (IV SOLUTION) ×6 IMPLANT
PACK GENERAL/GYN (CUSTOM PROCEDURE TRAY) ×3 IMPLANT
PAD ARMBOARD 7.5X6 YLW CONV (MISCELLANEOUS) ×3 IMPLANT
PENCIL SMOKE EVACUATOR (MISCELLANEOUS) ×3 IMPLANT
PIN CLAMP 2BAR 75MM BLUE (EXFIX) IMPLANT
PIN HALF ORANGE 5X200X45MM (EXFIX) IMPLANT
PIN HALF YELLOW 5X160X35 (EXFIX) IMPLANT
SEALANT PATCH FIBRIN 2X4IN (MISCELLANEOUS) IMPLANT
SPONGE T-LAP 18X18 ~~LOC~~+RFID (SPONGE) IMPLANT
STAPLER SKIN PROX WIDE 3.9 (STAPLE) IMPLANT
STAPLER VISISTAT 35W (STAPLE) ×3 IMPLANT
SUCTION POOLE HANDLE (INSTRUMENTS) ×2 IMPLANT
SUT CHROMIC 0 BP (SUTURE) IMPLANT
SUT ETHILON 3 0 FSL (SUTURE) IMPLANT
SUT PDS AB 1 TP1 54 (SUTURE) ×6 IMPLANT
SUT PDS AB 1 TP1 96 (SUTURE) IMPLANT
SUT SILK 2 0 SH CR/8 (SUTURE) ×3 IMPLANT
SUT SILK 2 0 TIES 10X30 (SUTURE) ×3 IMPLANT
SUT SILK 3 0 SH CR/8 (SUTURE) ×3 IMPLANT
SUT SILK 3 0 TIES 10X30 (SUTURE) ×3 IMPLANT
TOWEL GREEN STERILE (TOWEL DISPOSABLE) ×3 IMPLANT
TRAY FOLEY MTR SLVR 16FR STAT (SET/KITS/TRAYS/PACK) ×3 IMPLANT
YANKAUER SUCT BULB TIP NO VENT (SUCTIONS) IMPLANT
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
## 2023-09-20 NOTE — Anesthesia Preprocedure Evaluation (Addendum)
 Anesthesia Evaluation  Patient identified by MRN, date of birth, ID band Patient awake    Reviewed: Allergy & Precautions, H&P , NPO status , Patient's Chart, lab work & pertinent test results  Airway Mallampati: II  TM Distance: >3 FB Neck ROM: Full    Dental no notable dental hx. (+) Teeth Intact, Dental Advisory Given   Pulmonary neg pulmonary ROS   Pulmonary exam normal breath sounds clear to auscultation       Cardiovascular negative cardio ROS  Rhythm:Regular Rate:Normal     Neuro/Psych negative neurological ROS  negative psych ROS   GI/Hepatic negative GI ROS, Neg liver ROS,,,  Endo/Other  negative endocrine ROS    Renal/GU negative Renal ROS  negative genitourinary   Musculoskeletal   Abdominal   Peds  Hematology negative hematology ROS (+)   Anesthesia Other Findings   Reproductive/Obstetrics negative OB ROS                             Anesthesia Physical Anesthesia Plan  ASA: 1 and emergent  Anesthesia Plan: General   Post-op Pain Management: Ofirmev IV (intra-op)*   Induction: Intravenous, Rapid sequence and Cricoid pressure planned  PONV Risk Score and Plan: 3 and Ondansetron, Dexamethasone and Midazolam  Airway Management Planned: Oral ETT  Additional Equipment: Arterial line, CVP and Ultrasound Guidance Line Placement  Intra-op Plan:   Post-operative Plan: Extubation in OR  Informed Consent: I have reviewed the patients History and Physical, chart, labs and discussed the procedure including the risks, benefits and alternatives for the proposed anesthesia with the patient or authorized representative who has indicated his/her understanding and acceptance.     Dental advisory given  Plan Discussed with: CRNA  Anesthesia Plan Comments:        Anesthesia Quick Evaluation
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
## 2023-09-20 NOTE — Anesthesia Procedure Notes (Signed)
 Arterial Line Insertion Start/End2/16/2025 10:05 AM, 09/20/2023 10:15 AM Performed by: Jodell Cipro, CRNA, CRNA  Patient location: OR. Emergency situation Right, radial was placed Catheter size: 20 G Hand hygiene performed , maximum sterile barriers used  and Seldinger technique used Allen's test indicative of satisfactory collateral circulation Attempts: 2 (2 attempts L radial by S. Jenisis Harmsen CRNA unable to advance catheter. 1 attempt by R. Mahoney CRNA to L radial. R radial accessed but unable to thread catheter. CRNA to L radial S. Kristian Hazzard CRNA 1 attempt) Procedure performed without using ultrasound guided technique. Following insertion, Biopatch. Patient tolerated the procedure well with no immediate complications.
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
## 2023-09-20 NOTE — Anesthesia Postprocedure Evaluation (Signed)
 Anesthesia Post Note  Patient: Alex Cain  Procedure(s) Performed: EXPLORATORY LAPAROTOMY, HEPATORRAPHY EXTERNAL FIXATION LEFT  LEG (Left: Leg Lower)     Patient location during evaluation: PACU Anesthesia Type: General Level of consciousness: awake and alert Pain management: pain level controlled Vital Signs Assessment: post-procedure vital signs reviewed and stable Respiratory status: spontaneous breathing, nonlabored ventilation, respiratory function stable and patient connected to nasal cannula oxygen Cardiovascular status: blood pressure returned to baseline and stable Postop Assessment: no apparent nausea or vomiting Anesthetic complications: no  No notable events documented.  Last Vitals:  Vitals:   09/20/23 1400 09/20/23 1407  BP: 137/79   Pulse: 90   Resp: 18 20  Temp:    SpO2: 100% 100%    Last Pain:  Vitals:   09/20/23 1407  TempSrc:   PainSc: 10-Worst pain ever                 [name],W. EDMOND

## 2023-09-20 NOTE — Progress Notes (Signed)
 Ortho Trauma Note  Will tentatively plan for surgery tomorrow with Dr. Carola Frost for retrograde intramedullary nailing of left femur.  Roby Lofts, MD [facility] [phone] (office) [email or web address]

## 2023-09-20 NOTE — ED Notes (Signed)
Dr. Rameriz at bedside.

## 2023-09-20 NOTE — Progress Notes (Signed)
 Ok per Dr. Bedelia Person for ice chips, NPO after midnight.
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
## 2023-09-20 NOTE — Progress Notes (Signed)
 Portable equipment paged to PACU to get PCA pump.

## 2023-09-20 NOTE — Progress Notes (Signed)
 Brother Antonio updated.  He will update other family members.  This RN will call him back when we have a room assignment.
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
## 2023-09-20 NOTE — ED Notes (Signed)
Tourniquet removed

## 2023-09-20 NOTE — Anesthesia Procedure Notes (Signed)
 Central Venous Catheter Insertion Performed by: Gaynelle Adu, MD, anesthesiologist Start/End2/16/2025 10:00 AM, 09/20/2023 10:15 AM Patient location: Pre-op. Preanesthetic checklist: patient identified, IV checked, site marked, risks and benefits discussed, surgical consent, monitors and equipment checked, pre-op evaluation, timeout performed and anesthesia consent Position: Trendelenburg Lidocaine 1% used for infiltration and patient sedated Hand hygiene performed , maximum sterile barriers used  and Seldinger technique used Catheter size: 9 Fr Total catheter length 10. Central line was placed.MAC introducer Procedure performed using ultrasound guided technique. Ultrasound Notes:anatomy identified, needle tip was noted to be adjacent to the nerve/plexus identified, no ultrasound evidence of intravascular and/or intraneural injection and image(s) printed for medical record Attempts: 1 Following insertion, line sutured, dressing applied and Biopatch. Post procedure assessment: blood return through all ports, free fluid flow and no air  Patient tolerated the procedure well with no immediate complications.
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
## 2023-09-20 NOTE — Anesthesia Procedure Notes (Signed)
 Procedure Name: Intubation Date/Time: 09/20/2023 9:58 AM  Performed by: Garfield Cornea, CRNAPre-anesthesia Checklist: Patient identified, Emergency Drugs available, Suction available and Patient being monitored Patient Re-evaluated:Patient Re-evaluated prior to induction Oxygen Delivery Method: Circle System Utilized Preoxygenation: Pre-oxygenation with 100% oxygen Induction Type: IV induction Ventilation: Mask ventilation without difficulty Laryngoscope Size: Mac and 4 Grade View: Grade I Tube type: Oral Tube size: 7.5 mm Number of attempts: 1 Airway Equipment and Method: Stylet and Oral airway Placement Confirmation: ETT inserted through vocal cords under direct vision, positive ETCO2 and breath sounds checked- equal and bilateral Secured at: 23 cm Tube secured with: Tape Dental Injury: Teeth and Oropharynx as per pre-operative assessment
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
## 2023-09-20 NOTE — Transfer of Care (Signed)
 Immediate Anesthesia Transfer of Care Note  Patient: Alex Cain  Procedure(s) Performed: EXPLORATORY LAPAROTOMY, HEPATORRAPHY EXTERNAL FIXATION LEFT  LEG (Left: Leg Lower)  Patient Location: PACU  Anesthesia Type:General  Level of Consciousness: awake and alert   Airway & Oxygen Therapy: Patient Spontanous Breathing  Post-op Assessment: Report given to RN and Post -op Vital signs reviewed and stable  Post vital signs: Reviewed and stable  Last Vitals:  Vitals Value Taken Time  BP 144/54 09/20/23 1130  Temp    Pulse 109 09/20/23 1131  Resp 22 09/20/23 1131  SpO2 98 % 09/20/23 1130  Vitals shown include unfiled device data.  Last Pain:  Vitals:   09/20/23 0922  TempSrc: Temporal  PainSc:          Complications: No notable events documented.

## 2023-09-21 ENCOUNTER — Inpatient Hospital Stay (HOSPITAL_COMMUNITY): Payer: BLUE CROSS/BLUE SHIELD | Admitting: Anesthesiology

## 2023-09-21 ENCOUNTER — Encounter (HOSPITAL_COMMUNITY): Admission: EM | Disposition: A | Discharge status: Home / Self Care | Payer: Self-pay | Source: Home / Self Care

## 2023-09-21 ENCOUNTER — Inpatient Hospital Stay (HOSPITAL_COMMUNITY): Payer: BLUE CROSS/BLUE SHIELD

## 2023-09-21 ENCOUNTER — Encounter (HOSPITAL_COMMUNITY): Payer: Self-pay | Admitting: General Surgery

## 2023-09-21 HISTORY — PX: FEMUR IM NAIL: SHX1597

## 2023-09-21 LAB — BPAM FFP
Blood Product Expiration Date: 202502192359
Blood Product Expiration Date: 202502192359
Blood Product Expiration Date: 202502192359
Blood Product Expiration Date: 202502192359
ISSUE DATE / TIME: 202502161028
ISSUE DATE / TIME: 202502161028
ISSUE DATE / TIME: 202502161028
ISSUE DATE / TIME: 202502161028
Unit Type and Rh: 6200
Unit Type and Rh: 6200
Unit Type and Rh: 6200
Unit Type and Rh: 6200

## 2023-09-21 LAB — PREPARE FRESH FROZEN PLASMA

## 2023-09-21 LAB — BASIC METABOLIC PANEL
Anion gap: 6 (ref 5–15)
BUN: 13 mg/dL (ref 6–20)
CO2: 25 mmol/L (ref 22–32)
Calcium: 7.9 mg/dL — ABNORMAL LOW (ref 8.9–10.3)
Chloride: 106 mmol/L (ref 98–111)
Creatinine, Ser: 1.39 mg/dL — ABNORMAL HIGH (ref 0.61–1.24)
GFR, Estimated: >60 mL/min (ref >60)
Glucose, Bld: 104 mg/dL — ABNORMAL HIGH (ref 70–99)
Potassium: 4.1 mmol/L (ref 3.5–5.1)
Sodium: 137 mmol/L (ref 135–145)

## 2023-09-21 LAB — CBC
HCT: 27.3 % — ABNORMAL LOW (ref 39.0–52.0)
Hemoglobin: 9.5 g/dL — ABNORMAL LOW (ref 13.0–17.0)
MCH: 32 pg (ref 26.0–34.0)
MCHC: 34.8 g/dL (ref 30.0–36.0)
MCV: 91.9 fL (ref 80.0–100.0)
Platelets: 125 10*3/uL — ABNORMAL LOW (ref 150–400)
RBC: 2.97 MIL/uL — ABNORMAL LOW (ref 4.22–5.81)
RDW: 15.1 % (ref 11.5–15.5)
WBC: 23.2 10*3/uL — ABNORMAL HIGH (ref 4.0–10.5)
nRBC: 0 % (ref 0.0–0.2)

## 2023-09-21 LAB — SURGICAL PCR SCREEN
MRSA, PCR: POSITIVE — AB
Staphylococcus aureus: POSITIVE — AB

## 2023-09-21 SURGERY — INSERTION, INTRAMEDULLARY ROD, FEMUR, RETROGRADE
Anesthesia: General | Laterality: Left

## 2023-09-21 MED ORDER — MUPIROCIN 2 % EX OINT
1.0000 | TOPICAL_OINTMENT | Freq: Two times a day (BID) | CUTANEOUS | Status: AC
  Administered 2023-09-21 – 2023-09-25 (×10): 1 via NASAL
  Filled 2023-09-21 (×2): qty 22

## 2023-09-21 MED ORDER — KETAMINE HCL 50 MG/5ML IJ SOSY
PREFILLED_SYRINGE | INTRAMUSCULAR | Status: AC
  Filled 2023-09-21: qty 5

## 2023-09-21 MED ORDER — SUGAMMADEX SODIUM 200 MG/2ML IV SOLN
INTRAVENOUS | Status: DC | PRN
  Administered 2023-09-21: 150 mg via INTRAVENOUS

## 2023-09-21 MED ORDER — MIDAZOLAM HCL 2 MG/2ML IJ SOLN
INTRAMUSCULAR | Status: AC
  Filled 2023-09-21: qty 2

## 2023-09-21 MED ORDER — LACTATED RINGERS IV SOLN: INTRAVENOUS | Status: DC

## 2023-09-21 MED ORDER — FENTANYL CITRATE (PF) 250 MCG/5ML IJ SOLN
INTRAMUSCULAR | Status: AC
  Filled 2023-09-21: qty 5

## 2023-09-21 MED ORDER — CHLORHEXIDINE GLUCONATE 0.12 % MT SOLN
15.0000 mL | Freq: Once | OROMUCOSAL | Status: AC
  Administered 2023-09-21: 15 mL via OROMUCOSAL
  Filled 2023-09-21: qty 15

## 2023-09-21 MED ORDER — CHLORHEXIDINE GLUCONATE 0.12 % MT SOLN
15.0000 mL | Freq: Once | OROMUCOSAL | Status: AC
  Administered 2023-09-21: 15 mL via OROMUCOSAL

## 2023-09-21 MED ORDER — ACETAMINOPHEN 10 MG/ML IV SOLN
1000.0000 mg | Freq: Four times a day (QID) | INTRAVENOUS | Status: AC
  Administered 2023-09-21 – 2023-09-22 (×4): 1000 mg via INTRAVENOUS
  Filled 2023-09-21 (×4): qty 100

## 2023-09-21 MED ORDER — FENTANYL CITRATE (PF) 250 MCG/5ML IJ SOLN
INTRAMUSCULAR | Status: DC | PRN
  Administered 2023-09-21 (×2): 50 ug via INTRAVENOUS

## 2023-09-21 MED ORDER — KETAMINE HCL 50 MG/5ML IJ SOSY
PREFILLED_SYRINGE | INTRAMUSCULAR | Status: DC | PRN
  Administered 2023-09-21: 50 mg via INTRAVENOUS

## 2023-09-21 MED ORDER — LIDOCAINE 2% (20 MG/ML) 5 ML SYRINGE
INTRAMUSCULAR | Status: DC | PRN
  Administered 2023-09-21: 60 mg via INTRAVENOUS

## 2023-09-21 MED ORDER — 0.9 % SODIUM CHLORIDE (POUR BTL) OPTIME
TOPICAL | Status: DC | PRN
  Administered 2023-09-21: 1000 mL

## 2023-09-21 MED ORDER — PROPOFOL 10 MG/ML IV BOLUS
INTRAVENOUS | Status: AC
  Filled 2023-09-21: qty 20

## 2023-09-21 MED ORDER — DEXAMETHASONE SODIUM PHOSPHATE 10 MG/ML IJ SOLN
INTRAMUSCULAR | Status: DC | PRN
  Administered 2023-09-21: 5 mg via INTRAVENOUS

## 2023-09-21 MED ORDER — MIDAZOLAM HCL 2 MG/2ML IJ SOLN
INTRAMUSCULAR | Status: DC | PRN
  Administered 2023-09-21: 2 mg via INTRAVENOUS

## 2023-09-21 MED ORDER — METHOCARBAMOL 1000 MG/10ML IJ SOLN: 500.0000 mg | Freq: Three times a day (TID) | INTRAMUSCULAR | Status: DC

## 2023-09-21 MED ORDER — ORAL CARE MOUTH RINSE: 15.0000 mL | Freq: Once | OROMUCOSAL | Status: AC

## 2023-09-21 MED ORDER — METHOCARBAMOL 500 MG PO TABS: 1000.0000 mg | ORAL_TABLET | Freq: Three times a day (TID) | ORAL | Status: DC

## 2023-09-21 MED ORDER — METHOCARBAMOL 500 MG PO TABS
1000.0000 mg | ORAL_TABLET | Freq: Three times a day (TID) | ORAL | Status: AC
  Filled 2023-09-21: qty 2

## 2023-09-21 MED ORDER — ACETAMINOPHEN 500 MG PO TABS: 1000.0000 mg | ORAL_TABLET | Freq: Once | ORAL | Status: DC

## 2023-09-21 MED ORDER — PIPERACILLIN-TAZOBACTAM 3.375 G IVPB 30 MIN
3.3750 g | Freq: Once | INTRAVENOUS | Status: AC
  Administered 2023-09-21: 3.375 g via INTRAVENOUS
  Filled 2023-09-21: qty 50

## 2023-09-21 MED ORDER — METHOCARBAMOL 1000 MG/10ML IJ SOLN
1000.0000 mg | Freq: Three times a day (TID) | INTRAMUSCULAR | Status: AC
  Administered 2023-09-21 – 2023-09-23 (×5): 1000 mg via INTRAVENOUS
  Filled 2023-09-21 (×5): qty 10

## 2023-09-21 MED ORDER — SODIUM CHLORIDE 0.9 % IV SOLN
2.0000 g | INTRAVENOUS | Status: AC
  Administered 2023-09-21 – 2023-09-22 (×2): 2 g via INTRAVENOUS
  Filled 2023-09-21 (×2): qty 20

## 2023-09-21 MED ORDER — PHENYLEPHRINE 80 MCG/ML (10ML) SYRINGE FOR IV PUSH (FOR BLOOD PRESSURE SUPPORT)
PREFILLED_SYRINGE | INTRAVENOUS | Status: DC | PRN
  Administered 2023-09-21: 160 ug via INTRAVENOUS
  Administered 2023-09-21: 80 ug via INTRAVENOUS
  Administered 2023-09-21 (×2): 120 ug via INTRAVENOUS

## 2023-09-21 MED ORDER — ROCURONIUM BROMIDE 10 MG/ML (PF) SYRINGE
PREFILLED_SYRINGE | INTRAVENOUS | Status: DC | PRN
  Administered 2023-09-21: 50 mg via INTRAVENOUS

## 2023-09-21 MED ORDER — PROPOFOL 10 MG/ML IV BOLUS
INTRAVENOUS | Status: DC | PRN
  Administered 2023-09-21: 200 mg via INTRAVENOUS

## 2023-09-21 MED ORDER — CHLORHEXIDINE GLUCONATE CLOTH 2 % EX PADS
6.0000 | MEDICATED_PAD | Freq: Every day | CUTANEOUS | Status: AC
  Administered 2023-09-21 – 2023-09-24 (×4): 6 via TOPICAL

## 2023-09-21 MED ORDER — PHENYLEPHRINE HCL-NACL 20-0.9 MG/250ML-% IV SOLN
INTRAVENOUS | Status: DC | PRN
Start: 2023-09-21 — End: 2023-09-21
  Administered 2023-09-21: 40 ug/min via INTRAVENOUS

## 2023-09-21 MED ORDER — HYDROMORPHONE HCL 1 MG/ML IJ SOLN
0.2500 mg | INTRAMUSCULAR | Status: DC | PRN
Start: 2023-09-21 — End: 2023-09-21

## 2023-09-21 MED ORDER — ONDANSETRON HCL 4 MG/2ML IJ SOLN
INTRAMUSCULAR | Status: DC | PRN
Start: 2023-09-21 — End: 2023-09-21
  Administered 2023-09-21: 4 mg via INTRAVENOUS

## 2023-09-21 SURGICAL SUPPLY — 65 items
BAG COUNTER SPONGE SURGICOUNT (BAG) ×2 IMPLANT
BANDAGE ESMARK 6X9 LF (GAUZE/BANDAGES/DRESSINGS) IMPLANT
BIT DRILL CALIBRATED 4.3MMX365 (DRILL) IMPLANT
BIT DRILL CROWE PNT TWST 4.5MM (DRILL) IMPLANT
BNDG COHESIVE 4X5 TAN STRL (GAUZE/BANDAGES/DRESSINGS) ×2 IMPLANT
BNDG ELASTIC 4INX 5YD STR LF (GAUZE/BANDAGES/DRESSINGS) IMPLANT
BNDG ELASTIC 4X5.8 VLCR STR LF (GAUZE/BANDAGES/DRESSINGS) ×2 IMPLANT
BNDG ELASTIC 6X10 VLCR STRL LF (GAUZE/BANDAGES/DRESSINGS) IMPLANT
BNDG ELASTIC 6X5.8 VLCR STR LF (GAUZE/BANDAGES/DRESSINGS) ×2 IMPLANT
BNDG ESMARK 6X9 LF (GAUZE/BANDAGES/DRESSINGS) IMPLANT
BNDG GAUZE DERMACEA FLUFF 4 (GAUZE/BANDAGES/DRESSINGS) ×2 IMPLANT
BRUSH SCRUB EZ PLAIN DRY (MISCELLANEOUS) ×4 IMPLANT
COVER MAYO STAND STRL (DRAPES) ×2 IMPLANT
COVER SURGICAL LIGHT HANDLE (MISCELLANEOUS) ×4 IMPLANT
DRAPE C-ARM 42X72 X-RAY (DRAPES) ×2 IMPLANT
DRAPE C-ARMOR (DRAPES) ×2 IMPLANT
DRAPE IMP U-DRAPE 54X76 (DRAPES) ×2 IMPLANT
DRAPE INCISE IOBAN 66X45 STRL (DRAPES) ×2 IMPLANT
DRAPE SURG ORHT 6 SPLT 77X108 (DRAPES) ×4 IMPLANT
DRAPE U-SHAPE 47X51 STRL (DRAPES) ×2 IMPLANT
DRESSING MEPILEX FLEX 4X4 (GAUZE/BANDAGES/DRESSINGS) IMPLANT
DRILL CALIBRATED 4.3MMX365 (DRILL) ×1 IMPLANT
DRILL CROWE POINT TWIST 4.5MM (DRILL) ×1 IMPLANT
DRSG MEPILEX FLEX 4X4 (GAUZE/BANDAGES/DRESSINGS) ×5 IMPLANT
DRSG MEPILEX POST OP 4X8 (GAUZE/BANDAGES/DRESSINGS) IMPLANT
DRSG MEPITEL 4X7.2 (GAUZE/BANDAGES/DRESSINGS) IMPLANT
ELECT REM PT RETURN 9FT ADLT (ELECTROSURGICAL) ×1 IMPLANT
ELECTRODE REM PT RTRN 9FT ADLT (ELECTROSURGICAL) ×2 IMPLANT
EVACUATOR 1/8 PVC DRAIN (DRAIN) IMPLANT
GAUZE PAD ABD 7.5X8 STRL (GAUZE/BANDAGES/DRESSINGS) IMPLANT
GAUZE SPONGE 4X4 12PLY STRL (GAUZE/BANDAGES/DRESSINGS) ×2 IMPLANT
GAUZE XEROFORM 1X8 LF (GAUZE/BANDAGES/DRESSINGS) ×2 IMPLANT
GLOVE BIO SURGEON STRL SZ7.5 (GLOVE) ×2 IMPLANT
GLOVE BIO SURGEON STRL SZ8 (GLOVE) ×2 IMPLANT
GLOVE BIOGEL PI IND STRL 7.5 (GLOVE) ×2 IMPLANT
GLOVE BIOGEL PI IND STRL 8 (GLOVE) ×2 IMPLANT
GLOVE SURG ORTHO LTX SZ7.5 (GLOVE) ×4 IMPLANT
GOWN STRL REUS W/ TWL LRG LVL3 (GOWN DISPOSABLE) ×4 IMPLANT
GOWN STRL REUS W/ TWL XL LVL3 (GOWN DISPOSABLE) ×2 IMPLANT
GUIDEPIN VERSANAIL DSP 3.2X444 (ORTHOPEDIC DISPOSABLE SUPPLIES) IMPLANT
GUIDEWIRE BEAD TIP (WIRE) IMPLANT
KIT BASIN OR (CUSTOM PROCEDURE TRAY) ×2 IMPLANT
KIT TURNOVER KIT B (KITS) ×2 IMPLANT
NAIL FEM RETRO 9X400 (Nail) IMPLANT
PACK ORTHO EXTREMITY (CUSTOM PROCEDURE TRAY) ×2 IMPLANT
PACK UNIVERSAL I (CUSTOM PROCEDURE TRAY) ×2 IMPLANT
PAD ARMBOARD 7.5X6 YLW CONV (MISCELLANEOUS) ×4 IMPLANT
SCREW CORT TI DBL LEAD 5X34 (Screw) IMPLANT
SCREW CORT TI DBL LEAD 5X40 (Screw) IMPLANT
SCREW CORT TI DBL LEAD 5X46 (Screw) IMPLANT
SCREW CORT TI DBL LEAD 5X75 (Screw) IMPLANT
SCREW CORT TI DBLE LEAD 5X54 (Screw) IMPLANT
SPONGE T-LAP 18X18 ~~LOC~~+RFID (SPONGE) ×2 IMPLANT
STAPLER VISISTAT 35W (STAPLE) ×2 IMPLANT
STOCKINETTE IMPERVIOUS LG (DRAPES) ×2 IMPLANT
SUT ETHILON 2 0 PSLX (SUTURE) IMPLANT
SUT PROLENE 3 0 PS 2 (SUTURE) IMPLANT
SUT VIC AB 0 CT1 27XBRD ANBCTR (SUTURE) IMPLANT
SUT VIC AB 2-0 CT1 (SUTURE) IMPLANT
SUT VIC AB 2-0 CT1 TAPERPNT 27 (SUTURE) IMPLANT
SUT VIC AB 2-0 CT3 27 (SUTURE) IMPLANT
TOWEL GREEN STERILE (TOWEL DISPOSABLE) ×4 IMPLANT
TOWEL GREEN STERILE FF (TOWEL DISPOSABLE) ×2 IMPLANT
TUBE CONNECTING 12X1/4 (SUCTIONS) ×2 IMPLANT
YANKAUER SUCT BULB TIP NO VENT (SUCTIONS) ×2 IMPLANT

## 2023-09-21 NOTE — Transfer of Care (Signed)
 Immediate Anesthesia Transfer of Care Note  Patient: Alex Cain  Procedure(s) Performed: INTRAMEDULLARY (IM) RETROGRADE FEMORAL NAILING (Left)  Patient Location: PACU  Anesthesia Type:General  Level of Consciousness: awake, drowsy, and patient cooperative  Airway & Oxygen Therapy: Patient Spontanous Breathing and Patient connected to face mask oxygen  Post-op Assessment: Report given to RN and Post -op Vital signs reviewed and stable  Post vital signs: Reviewed and stable  Last Vitals:  Vitals Value Taken Time  BP 129/64 09/21/23 1033  Temp    Pulse 99 09/21/23 1036  Resp 24 09/21/23 1036  SpO2 100 % 09/21/23 1036  Vitals shown include unfiled device data.  Last Pain:  Vitals:   09/21/23 0745  TempSrc: Oral  PainSc:          Complications: No notable events documented.
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
## 2023-09-21 NOTE — Progress Notes (Signed)
 No changes overnight. Patient angry because of belly pain that he does not feel like is being treated. The PCA was explained to him but not to his satisfaction.  Denies left femur pain.  The risks and benefits of left femur repair and removal of external fixator were discussed with the patient, including the possibility of infection, nerve injury, vessel injury, wound breakdown, arthritis, symptomatic hardware, DVT/ PE, loss of motion, malunion, nonunion, and need for further surgery among others. These risks were acknowledged and consent was provided to proceed.  Myrene Galas, MD [facility], [facility] [phone]

## 2023-09-21 NOTE — Anesthesia Postprocedure Evaluation (Signed)
 Anesthesia Post Note  Patient: Alex Cain  Procedure(s) Performed: INTRAMEDULLARY (IM) RETROGRADE FEMORAL NAILING (Left)     Patient location during evaluation: PACU Anesthesia Type: General Level of consciousness: awake and alert Pain management: pain level controlled Vital Signs Assessment: post-procedure vital signs reviewed and stable Respiratory status: spontaneous breathing, nonlabored ventilation, respiratory function stable and patient connected to nasal cannula oxygen Cardiovascular status: blood pressure returned to baseline and stable Postop Assessment: no apparent nausea or vomiting Anesthetic complications: no  No notable events documented.  Last Vitals:  Vitals:   09/21/23 1045 09/21/23 1100  BP: 134/76 137/75  Pulse: 99 94  Resp: (!) 22 17  Temp:  (!) 36.2 C  SpO2: 100% 100%    Last Pain:  Vitals:   09/21/23 1045  TempSrc:   PainSc: Asleep                 [name]

## 2023-09-21 NOTE — Plan of Care (Signed)

## 2023-09-21 NOTE — Progress Notes (Signed)
..  Trauma Event Note  Rounded Note: Pt reports pain controlled with PCA, no visitors at this time. Bandages to bilat thighs clean and dry.  Pt does appear slightly anxious at this time, ice chips provided, explained ice chips only available until midnight due to procedure in am.    Last imported Vital Signs BP 138/84   Pulse 66   Temp 98.8 F (37.1 C) (Oral)   Resp 17   Ht 5\' 7"  (1.702 m)   Wt 154 lb 5.2 oz (70 kg)   SpO2 98%   BMI 24.17 kg/m   Trending CBC Recent Labs    09/20/23 0937 09/20/23 1024 09/20/23 1115  WBC 8.7  --   --   HGB 13.3  13.9 8.8* 9.9*  HCT 41.1  41.0 26.0* 29.0*  PLT 213  --   --     Trending Coag's Recent Labs    09/20/23 0937  INR 1.1    Trending BMET Recent Labs    09/20/23 0937 09/20/23 1024 09/20/23 1115  NA 137  137 135 137  K 3.1*  3.0* 3.5 4.2  CL 101  100  --   --   CO2 23  --   --   BUN 9  11  --   --   CREATININE 1.38*  1.40*  --   --   GLUCOSE 167*  157*  --   --       Alex Cain  Trauma Response RN  Please call TRN at [phone] for further assistance.

## 2023-09-21 NOTE — Anesthesia Procedure Notes (Signed)
 Procedure Name: Intubation Date/Time: 09/21/2023 8:34 AM  Performed by: Yolonda Kida, CRNAPre-anesthesia Checklist: Patient identified, Emergency Drugs available, Suction available and Patient being monitored Patient Re-evaluated:Patient Re-evaluated prior to induction Oxygen Delivery Method: Circle System Utilized Preoxygenation: Pre-oxygenation with 100% oxygen Induction Type: IV induction Ventilation: Mask ventilation without difficulty Laryngoscope Size: Mac and 4 Grade View: Grade I Tube type: Oral Tube size: 7.5 mm Number of attempts: 1 Airway Equipment and Method: Stylet Placement Confirmation: ETT inserted through vocal cords under direct vision, positive ETCO2 and breath sounds checked- equal and bilateral Secured at: 23 cm Tube secured with: Tape Dental Injury: Teeth and Oropharynx as per pre-operative assessment
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
## 2023-09-21 NOTE — Significant Event (Addendum)
 Patient has several visitors this evening who are not the same visitors. Went over the visitation policy with the patient who is a confidential/private encounter to protect him that we need to keep two of the same visitors during his admission. Patient verbalize understand at this time. A copy of visitation sheet given to patient for reference.

## 2023-09-22 ENCOUNTER — Encounter (HOSPITAL_COMMUNITY): Payer: Self-pay | Admitting: Orthopedic Surgery

## 2023-09-22 DIAGNOSIS — F430 Acute stress reaction: Secondary | ICD-10-CM

## 2023-09-22 LAB — BASIC METABOLIC PANEL
Anion gap: 11 (ref 5–15)
BUN: 16 mg/dL (ref 6–20)
CO2: 26 mmol/L (ref 22–32)
Calcium: 8.1 mg/dL — ABNORMAL LOW (ref 8.9–10.3)
Chloride: 100 mmol/L (ref 98–111)
Creatinine, Ser: 1.24 mg/dL (ref 0.61–1.24)
GFR, Estimated: >60 mL/min (ref >60)
Glucose, Bld: 94 mg/dL (ref 70–99)
Potassium: 3.8 mmol/L (ref 3.5–5.1)
Sodium: 137 mmol/L (ref 135–145)

## 2023-09-22 LAB — CBC
HCT: 26.7 % — ABNORMAL LOW (ref 39.0–52.0)
HCT: 24.9 % — ABNORMAL LOW (ref 39.0–52.0)
HCT: 21.9 % — ABNORMAL LOW (ref 39.0–52.0)
Hemoglobin: 6.1 g/dL — CL (ref 13.0–17.0)
Hemoglobin: 8.2 g/dL — ABNORMAL LOW (ref 13.0–17.0)
Hemoglobin: 7.2 g/dL — ABNORMAL LOW (ref 13.0–17.0)
MCH: 31.3 pg (ref 26.0–34.0)
MCH: 30.5 pg (ref 26.0–34.0)
MCH: 30.6 pg (ref 26.0–34.0)
MCHC: 22.8 g/dL — ABNORMAL LOW (ref 30.0–36.0)
MCHC: 32.9 g/dL (ref 30.0–36.0)
MCHC: 32.9 g/dL (ref 30.0–36.0)
MCV: 136.9 fL — ABNORMAL HIGH (ref 80.0–100.0)
MCV: 92.6 fL (ref 80.0–100.0)
MCV: 93.2 fL (ref 80.0–100.0)
Platelets: 84 10*3/uL — ABNORMAL LOW (ref 150–400)
Platelets: 113 10*3/uL — ABNORMAL LOW (ref 150–400)
Platelets: 105 10*3/uL — ABNORMAL LOW (ref 150–400)
RBC: 1.95 MIL/uL — ABNORMAL LOW (ref 4.22–5.81)
RBC: 2.69 MIL/uL — ABNORMAL LOW (ref 4.22–5.81)
RBC: 2.35 MIL/uL — ABNORMAL LOW (ref 4.22–5.81)
RDW: 16 % — ABNORMAL HIGH (ref 11.5–15.5)
RDW: 14.9 % (ref 11.5–15.5)
RDW: 14.8 % (ref 11.5–15.5)
WBC: 18.4 10*3/uL — ABNORMAL HIGH (ref 4.0–10.5)
WBC: 27 10*3/uL — ABNORMAL HIGH (ref 4.0–10.5)
WBC: 21.9 10*3/uL — ABNORMAL HIGH (ref 4.0–10.5)
nRBC: 0 % (ref 0.0–0.2)
nRBC: 0 % (ref 0.0–0.2)
nRBC: 0 % (ref 0.0–0.2)

## 2023-09-22 LAB — POCT I-STAT EG7
Acid-base deficit: 1 mmol/L (ref 0.0–2.0)
Bicarbonate: 25 mmol/L (ref 20.0–28.0)
Calcium, Ion: 1.15 mmol/L (ref 1.15–1.40)
HCT: 26 % — ABNORMAL LOW (ref 39.0–52.0)
Hemoglobin: 8.8 g/dL — ABNORMAL LOW (ref 13.0–17.0)
O2 Saturation: 80 %
Potassium: 4.1 mmol/L (ref 3.5–5.1)
Sodium: 139 mmol/L (ref 135–145)
TCO2: 26 mmol/L (ref 22–32)
pCO2, Ven: 46.2 mmHg (ref 44–60)
pH, Ven: 7.341 (ref 7.25–7.43)
pO2, Ven: 47 mmHg — ABNORMAL HIGH (ref 32–45)

## 2023-09-22 MED ORDER — KCL IN DEXTROSE-NACL 20-5-0.45 MEQ/L-%-% IV SOLN
INTRAVENOUS | Status: AC
  Filled 2023-09-22 (×3): qty 1000
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
## 2023-09-23 LAB — BASIC METABOLIC PANEL
Anion gap: 9 (ref 5–15)
BUN: 12 mg/dL (ref 6–20)
CO2: 24 mmol/L (ref 22–32)
Calcium: 7.8 mg/dL — ABNORMAL LOW (ref 8.9–10.3)
Chloride: 100 mmol/L (ref 98–111)
Creatinine, Ser: 1.43 mg/dL — ABNORMAL HIGH (ref 0.61–1.24)
GFR, Estimated: >60 mL/min (ref >60)
Glucose, Bld: 117 mg/dL — ABNORMAL HIGH (ref 70–99)
Potassium: 3.6 mmol/L (ref 3.5–5.1)
Sodium: 133 mmol/L — ABNORMAL LOW (ref 135–145)

## 2023-09-23 LAB — URINALYSIS, ROUTINE W REFLEX MICROSCOPIC
Bilirubin Urine: NEGATIVE
Glucose, UA: NEGATIVE mg/dL
Ketones, ur: NEGATIVE mg/dL
Leukocytes,Ua: NEGATIVE
Nitrite: NEGATIVE
Protein, ur: 30 mg/dL — AB
RBC / HPF: >50 RBC/hpf (ref 0–5)
Specific Gravity, Urine: 1.006 (ref 1.005–1.030)
pH: 7 (ref 5.0–8.0)

## 2023-09-23 LAB — CBC
HCT: 19 % — ABNORMAL LOW (ref 39.0–52.0)
HCT: 23.7 % — ABNORMAL LOW (ref 39.0–52.0)
Hemoglobin: 6.3 g/dL — CL (ref 13.0–17.0)
Hemoglobin: 8.2 g/dL — ABNORMAL LOW (ref 13.0–17.0)
MCH: 31.2 pg (ref 26.0–34.0)
MCH: 31.2 pg (ref 26.0–34.0)
MCHC: 33.2 g/dL (ref 30.0–36.0)
MCHC: 34.6 g/dL (ref 30.0–36.0)
MCV: 94.1 fL (ref 80.0–100.0)
MCV: 90.1 fL (ref 80.0–100.0)
Platelets: 101 10*3/uL — ABNORMAL LOW (ref 150–400)
Platelets: 113 10*3/uL — ABNORMAL LOW (ref 150–400)
RBC: 2.02 MIL/uL — ABNORMAL LOW (ref 4.22–5.81)
RBC: 2.63 MIL/uL — ABNORMAL LOW (ref 4.22–5.81)
RDW: 14.6 % (ref 11.5–15.5)
RDW: 14.9 % (ref 11.5–15.5)
WBC: 20.2 10*3/uL — ABNORMAL HIGH (ref 4.0–10.5)
WBC: 18.6 10*3/uL — ABNORMAL HIGH (ref 4.0–10.5)
nRBC: 0 % (ref 0.0–0.2)
nRBC: 0 % (ref 0.0–0.2)

## 2023-09-23 LAB — PREPARE RBC (CROSSMATCH)

## 2023-09-23 MED ORDER — ACETAMINOPHEN 500 MG PO TABS
1000.0000 mg | ORAL_TABLET | Freq: Four times a day (QID) | ORAL | Status: DC
  Administered 2023-09-23 – 2023-09-25 (×7): 1000 mg via ORAL
  Filled 2023-09-23 (×7): qty 2

## 2023-09-23 MED ORDER — METHOCARBAMOL 1000 MG/10ML IJ SOLN: 1000.0000 mg | Freq: Three times a day (TID) | INTRAMUSCULAR | Status: DC

## 2023-09-23 MED ORDER — KCL IN DEXTROSE-NACL 20-5-0.45 MEQ/L-%-% IV SOLN
INTRAVENOUS | Status: DC
  Filled 2023-09-23: qty 1000

## 2023-09-23 MED ORDER — ACETAMINOPHEN 325 MG PO TABS
650.0000 mg | ORAL_TABLET | Freq: Four times a day (QID) | ORAL | Status: DC | PRN
  Administered 2023-09-23: 650 mg via ORAL
  Filled 2023-09-23: qty 2

## 2023-09-23 MED ORDER — METHOCARBAMOL 500 MG PO TABS
1000.0000 mg | ORAL_TABLET | Freq: Three times a day (TID) | ORAL | Status: DC
  Administered 2023-09-23 – 2023-09-26 (×8): 1000 mg via ORAL
  Filled 2023-09-23 (×9): qty 2

## 2023-09-23 MED ORDER — SODIUM CHLORIDE 0.9% IV SOLUTION: Freq: Once | INTRAVENOUS | Status: AC

## 2023-09-23 MED ORDER — OXYCODONE HCL 5 MG PO TABS
5.0000 mg | ORAL_TABLET | ORAL | Status: DC | PRN
  Administered 2023-09-23: 5 mg via ORAL
  Administered 2023-09-23 – 2023-09-26 (×9): 10 mg via ORAL
  Administered 2023-09-26: 5 mg via ORAL
  Filled 2023-09-23: qty 1
  Filled 2023-09-23 (×11): qty 2

## 2023-09-23 MED ORDER — HYDROMORPHONE HCL 1 MG/ML IJ SOLN
0.5000 mg | INTRAMUSCULAR | Status: DC | PRN
  Administered 2023-09-23: 0.5 mg via INTRAVENOUS
  Filled 2023-09-23: qty 0.5

## 2023-09-23 NOTE — TOC Progression Note (Signed)
 Transition of Care [facility]) - Progression Note    Patient Details  Name: Alex Cain MRN: [id number] Date of Birth: June 15, 1986  [facility] CM/SW Contact  Astrid Drafts Berna Spare, RN Phone Number: 09/23/2023, 1:38 PM  Clinical Narrative:    Discussed case with Victorino Dike in admissions at [facility]; she states she will be willing to take a look at the referral.  Patient's insurance is not in network with our rehab.  Referral sent to [facility] for review.       Barriers to Discharge: Continued Medical Work up  Expected Discharge Plan and Services   Discharge Planning Services: CM Consult   Living arrangements for the past 2 months: Hotel/Motel                                       Social Determinants of Health (SDOH) Interventions SDOH Screenings   Housing: High Risk (09/22/2023)  Utilities: Not At Risk (09/22/2023)  Tobacco Use: Low Risk  (09/21/2023)    Readmission Risk Interventions     No data to display         Quintella Baton, RN, BSN  Trauma/Neuro ICU Case Manager [phone]
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
## 2023-09-23 NOTE — Progress Notes (Signed)
 Date and time results received: 09/23/23 0600  Test: HGB Critical Value: 6.3  Name of Provider Notified:G. Metzger MD   Orders Received: 1 unit RBCs transfusion
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
## 2023-09-23 NOTE — Progress Notes (Signed)
 Patient had a low grade temp of 100.3.   0600-Received order for PRN PO tylenol
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
## 2023-09-23 NOTE — Discharge Instructions (Addendum)
 [facility]  [place] funds mental health services for residents on medicaid or who are uninsured. People with insurance can access urgent care resources at this location.    This is at the [facility]  [address] [place], [place] [address] [phone]  On the first floor they have a 24/7 psychiatric urgent care for emergences and a unit equipped to take care of mild detox; they can also help link you to rehab facilities.   [facility]      [facility], [place] [phone]  OPEN ABDOMINAL SURGERY: POST OP INSTRUCTIONS  Always review your discharge instruction sheet given to you by the facility where your surgery was performed.  IF YOU HAVE DISABILITY OR FAMILY LEAVE FORMS, YOU MUST BRING THEM TO THE OFFICE FOR PROCESSING.  PLEASE DO NOT GIVE THEM TO YOUR DOCTOR.  A prescription for pain medication may be given to you upon discharge.  Take your pain medication as prescribed, if needed.  If narcotic pain medicine is not needed, then you may take acetaminophen (Tylenol) or ibuprofen (Advil) as needed. Take your usually prescribed medications unless otherwise directed. If you need a refill on your pain medication, please contact your pharmacy. They will contact our office to request authorization.  Prescriptions will not be filled after 5pm or on week-ends. You should follow a light diet the first few days after arrival home, such as soup and crackers, pudding, etc.unless your doctor has advised otherwise. A high-fiber, low fat diet can be resumed as tolerated.   Be sure to include lots of fluids daily. Most patients will experience some swelling and bruising on the chest and neck area.  Ice packs will help.  Swelling and bruising can take several days to resolve Most patients will experience some swelling and bruising in the area of the incision. Ice pack will help. Swelling and bruising can take several days to resolve..  It is common to experience some  constipation if taking pain medication after surgery.  Increasing fluid intake and taking a stool softener will usually help or prevent this problem from occurring.  A mild laxative (Milk of Magnesia or Miralax) should be taken according to package directions if there are no bowel movements after 48 hours.  You may have steri-strips (small skin tapes) in place directly over the incision.  These strips should be left on the skin for 7-10 days.  If your surgeon used skin glue on the incision, you may shower in 24 hours.  The glue will flake off over the next 2-3 weeks.  Any sutures or staples will be removed at the office during your follow-up visit. You may find that a light gauze bandage over your incision may keep your staples from being rubbed or pulled. You may shower and replace the bandage daily. ACTIVITIES:  You may resume regular (light) daily activities beginning the next day--such as daily self-care, walking, climbing stairs--gradually increasing activities as tolerated.  You may have sexual intercourse when it is comfortable.  Refrain from any heavy lifting or straining until approved by your doctor. You may drive when you no longer are taking prescription pain medication, you can comfortably wear a seatbelt, and you can safely maneuver your car and apply brakes  You should see your doctor in the office for a follow-up appointment approximately two weeks after your surgery.  Make sure that you call for this appointment within a day or two after you arrive home to insure a convenient appointment time.  WHEN TO CALL YOUR DOCTOR: Fever over 101.0 Inability to urinate Nausea and/or vomiting Extreme swelling or bruising Continued bleeding from incision. Increased pain, redness, or drainage from the incision. Difficulty swallowing or breathing Muscle cramping or spasms. Numbness or tingling in hands or feet or around lips.  The clinic staff is available to answer your questions during regular  business hours.  Please don't hesitate to call and ask to speak to one of the nurses if you have concerns.  For further questions, please visit [email or web address]

## 2023-09-24 LAB — BPAM RBC
Blood Product Expiration Date: 202503122359
Blood Product Expiration Date: 202503122359
Blood Product Expiration Date: 202503122359
Blood Product Expiration Date: 202503122359
Blood Product Expiration Date: 202503122359
Blood Product Expiration Date: 202503122359
ISSUE DATE / TIME: 202502150004
ISSUE DATE / TIME: 202502161024
ISSUE DATE / TIME: 202502161024
ISSUE DATE / TIME: 202502161024
ISSUE DATE / TIME: 202502191139
ISSUE DATE / TIME: 202502200403
Unit Type and Rh: 5100
Unit Type and Rh: 5100
Unit Type and Rh: 5100
Unit Type and Rh: 5100
Unit Type and Rh: 5100
Unit Type and Rh: 5100

## 2023-09-24 LAB — TYPE AND SCREEN
ABO/RH(D): O POS
Antibody Screen: NEGATIVE
Unit division: 0
Unit division: 0
Unit division: 0
Unit division: 0
Unit division: 0
Unit division: 0

## 2023-09-24 LAB — BASIC METABOLIC PANEL WITH GFR
Anion gap: 10 (ref 5–15)
BUN: 7 mg/dL (ref 6–20)
CO2: 23 mmol/L (ref 22–32)
Calcium: 8.2 mg/dL — ABNORMAL LOW (ref 8.9–10.3)
Chloride: 104 mmol/L (ref 98–111)
Creatinine, Ser: 1.19 mg/dL (ref 0.61–1.24)
GFR, Estimated: >60 mL/min
Glucose, Bld: 99 mg/dL (ref 70–99)
Potassium: 3.8 mmol/L (ref 3.5–5.1)
Sodium: 137 mmol/L (ref 135–145)

## 2023-09-24 LAB — CBC
HCT: 23 % — ABNORMAL LOW (ref 39.0–52.0)
Hemoglobin: 7.7 g/dL — ABNORMAL LOW (ref 13.0–17.0)
MCH: 30.2 pg (ref 26.0–34.0)
MCHC: 33.5 g/dL (ref 30.0–36.0)
MCV: 90.2 fL (ref 80.0–100.0)
Platelets: 137 K/uL — ABNORMAL LOW (ref 150–400)
RBC: 2.55 MIL/uL — ABNORMAL LOW (ref 4.22–5.81)
RDW: 14.8 % (ref 11.5–15.5)
WBC: 16 K/uL — ABNORMAL HIGH (ref 4.0–10.5)
nRBC: 0 % (ref 0.0–0.2)

## 2023-09-24 MED ORDER — POLYETHYLENE GLYCOL 3350 17 G PO PACK: 17.0000 g | PACK | Freq: Every day | ORAL | Status: DC
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
## 2023-09-24 NOTE — TOC Progression Note (Signed)
 Transition of Care [facility]) - Progression Note    Patient Details  Name: Alex Cain MRN: [id number] Date of Birth: [birth year]  [facility] CM/SW Contact  Astrid Drafts Berna Spare, RN Phone Number: 09/24/2023, 3:40pm  Clinical Narrative:    Met with patient to discuss discharge plans; patient states he is "working on it." [employer], and patient said "Miss, I am not homeless, I have money. I just haven't figured out what to do yet. "  He declines Pensions consultant. We discussed possible recommendation of OP rehab and DME, and he is agreeable to this.  Will need orders for RW and tub seat; will refer to OP physical therapy at [facility] location.  Patient states he will work out his discharge plan, "so y'all can get rid of me." Assured patient that we are trying to help with resources that are available.   Expected Discharge Plan: OP Rehab Barriers to Discharge: Continued Medical Work up  Expected Discharge Plan and Services   Discharge Planning Services: CM Consult   Living arrangements for the past 2 months: Hotel/Motel                                       Social Determinants of Health (SDOH) Interventions SDOH Screenings   Housing: High Risk (09/22/2023)  Utilities: Not At Risk (09/22/2023)  Tobacco Use: Low Risk  (09/21/2023)    Readmission Risk Interventions     No data to display         Quintella Baton, RN, BSN  Trauma/Neuro ICU Case Manager [phone]
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
## 2023-09-24 NOTE — Progress Notes (Signed)
 Pt's foley removed per order. Peri care completed by pt. Pt sitting up in chair. Due to void. Dionne Bucy RN
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
## 2023-09-25 MED ORDER — SIMETHICONE 80 MG PO CHEW
80.0000 mg | CHEWABLE_TABLET | Freq: Four times a day (QID) | ORAL | Status: DC | PRN
  Administered 2023-09-25 – 2023-09-26 (×2): 80 mg via ORAL
  Filled 2023-09-25 (×2): qty 1

## 2023-09-25 MED ORDER — MAGNESIUM CITRATE PO SOLN: 0.5000 | Freq: Every day | ORAL | Status: DC | PRN

## 2023-09-25 MED ORDER — POLYETHYLENE GLYCOL 3350 17 G PO PACK
17.0000 g | PACK | Freq: Two times a day (BID) | ORAL | Status: DC
  Administered 2023-09-25 – 2023-09-26 (×3): 17 g via ORAL
  Filled 2023-09-25 (×3): qty 1

## 2023-09-25 MED ORDER — MAGNESIUM HYDROXIDE 400 MG/5ML PO SUSP
30.0000 mL | Freq: Every day | ORAL | Status: DC | PRN
  Administered 2023-09-25 – 2023-09-26 (×2): 30 mL via ORAL
  Filled 2023-09-25 (×2): qty 30
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
## 2023-09-25 NOTE — Progress Notes (Signed)
 Pt c/o abd pain 9/10 states it is under the staples in his abd. Attempt made by this RN to assess staples pt refused stating it is too painful. Educated pt on importance of assessment pt cont to refuse. Pt requested pain meds at this time.   Upon return to room with PRN oxycodone and scheduled Robaxin pt stated he was tired of taking medicine and felt the meds were causing his abd pain. RN educated pt on his right to refuse meds.   Pt stated LBM was this morning now stating his abd pain was cramping/gas pains. States his BM this morning was very small.   PA-C Johnson notified for possible need to increase bowel regimen. See new orders.
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
## 2023-09-26 LAB — BASIC METABOLIC PANEL
Anion gap: 9 (ref 5–15)
BUN: 12 mg/dL (ref 6–20)
CO2: 28 mmol/L (ref 22–32)
Calcium: 8.3 mg/dL — ABNORMAL LOW (ref 8.9–10.3)
Chloride: 99 mmol/L (ref 98–111)
Creatinine, Ser: 0.97 mg/dL (ref 0.61–1.24)
GFR, Estimated: >60 mL/min (ref >60)
Glucose, Bld: 98 mg/dL (ref 70–99)
Potassium: 3.8 mmol/L (ref 3.5–5.1)
Sodium: 136 mmol/L (ref 135–145)

## 2023-09-26 LAB — CBC
HCT: 21.8 % — ABNORMAL LOW (ref 39.0–52.0)
Hemoglobin: 7.3 g/dL — ABNORMAL LOW (ref 13.0–17.0)
MCH: 30.5 pg (ref 26.0–34.0)
MCHC: 33.5 g/dL (ref 30.0–36.0)
MCV: 91.2 fL (ref 80.0–100.0)
Platelets: 203 10*3/uL (ref 150–400)
RBC: 2.39 MIL/uL — ABNORMAL LOW (ref 4.22–5.81)
RDW: 14.2 % (ref 11.5–15.5)
WBC: 13.3 10*3/uL — ABNORMAL HIGH (ref 4.0–10.5)
nRBC: 0 % (ref 0.0–0.2)

## 2023-09-26 LAB — MAGNESIUM: Magnesium: 2 mg/dL (ref 1.7–2.4)

## 2023-09-26 MED ORDER — METHOCARBAMOL 1000 MG PO TABS: 1000.0000 mg | ORAL_TABLET | Freq: Three times a day (TID) | ORAL | 0 refills | Status: AC

## 2023-09-26 MED ORDER — ACETAMINOPHEN 500 MG PO TABS: 1000.0000 mg | ORAL_TABLET | Freq: Four times a day (QID) | ORAL | Status: AC

## 2023-09-26 MED ORDER — OXYCODONE HCL 5 MG PO TABS: 5.0000 mg | ORAL_TABLET | Freq: Four times a day (QID) | ORAL | 0 refills | Status: AC | PRN

## 2023-09-26 NOTE — Progress Notes (Addendum)
 Pt with orders to d/c home. PIV removed. Pt is stable. DME delivered to pt room. Discharge education and packet provided to pt all questions answered. Prescriptions sent to pharmacy pt is aware. Pt ambulatory to lobby but ride not arrived pt requested to wait in lobby with all belongings.
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
## 2023-09-28 NOTE — Progress Notes (Signed)
  Progress Note   Date: 09/25/2023  Patient Name: Alex Cain        MRN#: [id number]  Clarification of diagnosis:   Kidney Laceration Moderate (1-3 cm)   Juliet Rude, [facility] 09/28/2023, 8:03 AM Please see Amion for pager number during day hours 7:00am-4:30pm
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
## 2023-11-11 ENCOUNTER — Other Ambulatory Visit: Payer: Self-pay

## 2023-11-11 ENCOUNTER — Emergency Department (HOSPITAL_COMMUNITY)
Admission: EM | Admit: 2023-11-11 | Discharge: 2023-11-11 | Disposition: A | Discharge status: Home / Self Care | Payer: Self-pay | Attending: Emergency Medicine | Admitting: Emergency Medicine

## 2023-11-11 ENCOUNTER — Encounter (HOSPITAL_COMMUNITY): Payer: Self-pay | Admitting: *Deleted

## 2023-11-11 ENCOUNTER — Emergency Department (HOSPITAL_COMMUNITY): Payer: Self-pay

## 2023-11-11 DIAGNOSIS — T8149XA Infection following a procedure, other surgical site, initial encounter: Secondary | ICD-10-CM

## 2023-11-11 DIAGNOSIS — M79652 Pain in left thigh: Secondary | ICD-10-CM | POA: Insufficient documentation

## 2023-11-11 DIAGNOSIS — T8141XA Infection following a procedure, superficial incisional surgical site, initial encounter: Secondary | ICD-10-CM | POA: Insufficient documentation

## 2023-11-11 HISTORY — DX: Accidental discharge from unspecified firearms or gun, initial encounter: W34.00XA

## 2023-11-11 LAB — COMPREHENSIVE METABOLIC PANEL WITH GFR
ALT: 13 U/L (ref 0–44)
AST: 18 U/L (ref 15–41)
Albumin: 3.4 g/dL — ABNORMAL LOW (ref 3.5–5.0)
Alkaline Phosphatase: 84 U/L (ref 38–126)
Anion gap: 10 (ref 5–15)
BUN: 9 mg/dL (ref 6–20)
CO2: 25 mmol/L (ref 22–32)
Calcium: 9.2 mg/dL (ref 8.9–10.3)
Chloride: 106 mmol/L (ref 98–111)
Creatinine, Ser: 1.14 mg/dL (ref 0.61–1.24)
GFR, Estimated: >60 mL/min (ref >60)
Glucose, Bld: 83 mg/dL (ref 70–99)
Potassium: 4 mmol/L (ref 3.5–5.1)
Sodium: 141 mmol/L (ref 135–145)
Total Bilirubin: 0.4 mg/dL (ref 0.0–1.2)
Total Protein: 6.7 g/dL (ref 6.5–8.1)

## 2023-11-11 LAB — CBC WITH DIFFERENTIAL/PLATELET
Abs Immature Granulocytes: 0.04 10*3/uL (ref 0.00–0.07)
Basophils Absolute: 0.1 10*3/uL (ref 0.0–0.1)
Basophils Relative: 1 %
Eosinophils Absolute: 0.2 10*3/uL (ref 0.0–0.5)
Eosinophils Relative: 3 %
HCT: 44.3 % (ref 39.0–52.0)
Hemoglobin: 14.2 g/dL (ref 13.0–17.0)
Immature Granulocytes: 0 %
Lymphocytes Relative: 25 %
Lymphs Abs: 2.5 10*3/uL (ref 0.7–4.0)
MCH: 30.9 pg (ref 26.0–34.0)
MCHC: 32.1 g/dL (ref 30.0–36.0)
MCV: 96.5 fL (ref 80.0–100.0)
Monocytes Absolute: 0.7 10*3/uL (ref 0.1–1.0)
Monocytes Relative: 8 %
Neutro Abs: 6.2 10*3/uL (ref 1.7–7.7)
Neutrophils Relative %: 63 %
Platelets: 238 10*3/uL (ref 150–400)
RBC: 4.59 MIL/uL (ref 4.22–5.81)
RDW: 14.2 % (ref 11.5–15.5)
WBC: 9.8 10*3/uL (ref 4.0–10.5)
nRBC: 0 % (ref 0.0–0.2)

## 2023-11-11 MED ORDER — SULFAMETHOXAZOLE-TRIMETHOPRIM 800-160 MG PO TABS: 1.0000 | ORAL_TABLET | Freq: Two times a day (BID) | ORAL | 0 refills | Status: DC

## 2023-11-11 MED ORDER — SULFAMETHOXAZOLE-TRIMETHOPRIM 800-160 MG PO TABS
1.0000 | ORAL_TABLET | Freq: Once | ORAL | Status: AC
  Administered 2023-11-11: 1 via ORAL
  Filled 2023-11-11: qty 1

## 2023-11-11 MED ORDER — FAMOTIDINE 40 MG PO TABS: 40.0000 mg | ORAL_TABLET | Freq: Every day | ORAL | 0 refills | Status: DC

## 2023-11-11 MED ORDER — FAMOTIDINE 40 MG PO TABS: 40.0000 mg | ORAL_TABLET | Freq: Every day | ORAL | 0 refills | Status: AC

## 2023-11-11 MED ORDER — KETOROLAC TROMETHAMINE 10 MG PO TABS: 10.0000 mg | ORAL_TABLET | Freq: Four times a day (QID) | ORAL | 0 refills | Status: AC | PRN

## 2023-11-11 MED ORDER — KETOROLAC TROMETHAMINE 10 MG PO TABS: 10.0000 mg | ORAL_TABLET | Freq: Four times a day (QID) | ORAL | 0 refills | Status: DC | PRN
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
## 2023-11-11 NOTE — Discharge Instructions (Addendum)
 I would like you to take Bactrim once in the morning once in the evening for the next 10 days.  Tomorrow, call Dr. Magdalene Patricia office and tell them that you were seen in the emergency room.  You can tell them that the emergency room physician was concerned that you are developing an early infection at your surgical incision site, and he was able to express a small amount of pus from the wound.  You can tell them that you were started on Bactrim.  Please see if they can see you in the office this week for a wound check.  For pain, you can take Toradol every 6 hours for the next 5 days.  Please take famotidine with Toradol as this will help protect your stomach.
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
## 2023-11-19 ENCOUNTER — Emergency Department (HOSPITAL_BASED_OUTPATIENT_CLINIC_OR_DEPARTMENT_OTHER): Payer: Self-pay | Admitting: Radiology

## 2023-11-19 ENCOUNTER — Emergency Department (HOSPITAL_BASED_OUTPATIENT_CLINIC_OR_DEPARTMENT_OTHER): Payer: Self-pay

## 2023-11-19 ENCOUNTER — Other Ambulatory Visit: Payer: Self-pay

## 2023-11-19 ENCOUNTER — Encounter (HOSPITAL_BASED_OUTPATIENT_CLINIC_OR_DEPARTMENT_OTHER): Payer: Self-pay | Admitting: Emergency Medicine

## 2023-11-19 ENCOUNTER — Emergency Department (HOSPITAL_BASED_OUTPATIENT_CLINIC_OR_DEPARTMENT_OTHER)
Admission: EM | Admit: 2023-11-19 | Discharge: 2023-11-19 | Disposition: A | Discharge status: Home / Self Care | Payer: Self-pay | Attending: Emergency Medicine | Admitting: Emergency Medicine

## 2023-11-19 DIAGNOSIS — L03311 Cellulitis of abdominal wall: Secondary | ICD-10-CM | POA: Insufficient documentation

## 2023-11-19 DIAGNOSIS — D72829 Elevated white blood cell count, unspecified: Secondary | ICD-10-CM | POA: Insufficient documentation

## 2023-11-19 LAB — COMPREHENSIVE METABOLIC PANEL WITH GFR
ALT: 9 U/L (ref 0–44)
AST: 18 U/L (ref 15–41)
Albumin: 4.2 g/dL (ref 3.5–5.0)
Alkaline Phosphatase: 109 U/L (ref 38–126)
Anion gap: 8 (ref 5–15)
BUN: 10 mg/dL (ref 6–20)
CO2: 26 mmol/L (ref 22–32)
Calcium: 9.5 mg/dL (ref 8.9–10.3)
Chloride: 102 mmol/L (ref 98–111)
Creatinine, Ser: 0.98 mg/dL (ref 0.61–1.24)
GFR, Estimated: >60 mL/min (ref >60)
Glucose, Bld: 71 mg/dL (ref 70–99)
Potassium: 4.6 mmol/L (ref 3.5–5.1)
Sodium: 136 mmol/L (ref 135–145)
Total Bilirubin: 0.3 mg/dL (ref 0.0–1.2)
Total Protein: 7.1 g/dL (ref 6.5–8.1)

## 2023-11-19 LAB — CBC WITH DIFFERENTIAL/PLATELET
Abs Immature Granulocytes: 0.06 10*3/uL (ref 0.00–0.07)
Basophils Absolute: 0.1 10*3/uL (ref 0.0–0.1)
Basophils Relative: 0 %
Eosinophils Absolute: 0.1 10*3/uL (ref 0.0–0.5)
Eosinophils Relative: 1 %
HCT: 42.5 % (ref 39.0–52.0)
Hemoglobin: 14 g/dL (ref 13.0–17.0)
Immature Granulocytes: 0 %
Lymphocytes Relative: 15 %
Lymphs Abs: 2.1 10*3/uL (ref 0.7–4.0)
MCH: 31 pg (ref 26.0–34.0)
MCHC: 32.9 g/dL (ref 30.0–36.0)
MCV: 94.2 fL (ref 80.0–100.0)
Monocytes Absolute: 1.2 10*3/uL — ABNORMAL HIGH (ref 0.1–1.0)
Monocytes Relative: 8 %
Neutro Abs: 10.4 10*3/uL — ABNORMAL HIGH (ref 1.7–7.7)
Neutrophils Relative %: 76 %
Platelets: 195 10*3/uL (ref 150–400)
RBC: 4.51 MIL/uL (ref 4.22–5.81)
RDW: 13.9 % (ref 11.5–15.5)
WBC: 13.9 10*3/uL — ABNORMAL HIGH (ref 4.0–10.5)
nRBC: 0 % (ref 0.0–0.2)

## 2023-11-19 LAB — URINALYSIS, W/ REFLEX TO CULTURE (INFECTION SUSPECTED)
Bacteria, UA: NONE SEEN
Bilirubin Urine: NEGATIVE
Glucose, UA: NEGATIVE mg/dL
Hgb urine dipstick: NEGATIVE
Ketones, ur: NEGATIVE mg/dL
Leukocytes,Ua: NEGATIVE
Nitrite: NEGATIVE
Protein, ur: NEGATIVE mg/dL
Specific Gravity, Urine: 1.005 (ref 1.005–1.030)
pH: 7.5 (ref 5.0–8.0)

## 2023-11-19 LAB — LACTIC ACID, PLASMA: Lactic Acid, Venous: 0.8 mmol/L (ref 0.5–1.9)

## 2023-11-19 MED ORDER — SULFAMETHOXAZOLE-TRIMETHOPRIM 800-160 MG PO TABS
1.0000 | ORAL_TABLET | Freq: Two times a day (BID) | ORAL | 0 refills | Status: AC
  Filled 2023-11-19 – 2023-12-24 (×2): qty 14, 7d supply, fill #0

## 2023-11-19 MED ORDER — IOHEXOL 300 MG/ML  SOLN
100.0000 mL | Freq: Once | INTRAMUSCULAR | Status: AC | PRN
  Administered 2023-11-19: 100 mL via INTRAVENOUS

## 2023-11-19 MED ORDER — SULFAMETHOXAZOLE-TRIMETHOPRIM 800-160 MG PO TABS
1.0000 | ORAL_TABLET | Freq: Once | ORAL | Status: AC
  Administered 2023-11-19: 1 via ORAL
  Filled 2023-11-19: qty 1

## 2023-11-19 MED ORDER — KETOROLAC TROMETHAMINE 15 MG/ML IJ SOLN
15.0000 mg | Freq: Once | INTRAMUSCULAR | Status: AC
  Administered 2023-11-19: 15 mg via INTRAVENOUS
  Filled 2023-11-19: qty 1
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
## 2023-11-20 ENCOUNTER — Other Ambulatory Visit (HOSPITAL_BASED_OUTPATIENT_CLINIC_OR_DEPARTMENT_OTHER): Payer: Self-pay

## 2023-11-30 ENCOUNTER — Other Ambulatory Visit (HOSPITAL_BASED_OUTPATIENT_CLINIC_OR_DEPARTMENT_OTHER): Payer: Self-pay

## 2023-12-10 ENCOUNTER — Other Ambulatory Visit (HOSPITAL_BASED_OUTPATIENT_CLINIC_OR_DEPARTMENT_OTHER): Payer: Self-pay

## 2023-12-24 ENCOUNTER — Other Ambulatory Visit (HOSPITAL_BASED_OUTPATIENT_CLINIC_OR_DEPARTMENT_OTHER): Payer: Self-pay
